# Patient Record
Sex: Female | Born: 1990 | Race: Black or African American | Hispanic: No | Marital: Single | Smoking: Current some day smoker
Health system: Southern US, Community
[De-identification: ages and names within clinical notes are randomized; demographics above are authoritative.]

## PROBLEM LIST (undated history)

## (undated) ENCOUNTER — Ambulatory Visit: Admission: EM

## (undated) DIAGNOSIS — M1990 Unspecified osteoarthritis, unspecified site: Secondary | ICD-10-CM

## (undated) DIAGNOSIS — R1115 Cyclical vomiting syndrome unrelated to migraine: Secondary | ICD-10-CM

## (undated) DIAGNOSIS — R87629 Unspecified abnormal cytological findings in specimens from vagina: Secondary | ICD-10-CM

## (undated) HISTORY — PX: NO PAST SURGERIES: SHX2092

---
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
## 2008-01-01 ENCOUNTER — Ambulatory Visit: Payer: Self-pay | Admitting: Family Medicine

## 2008-01-01 LAB — CONVERTED CEMR LAB: Beta hcg, urine, semiquantitative: POSITIVE

## 2008-01-10 ENCOUNTER — Telehealth: Payer: Self-pay | Admitting: Family Medicine

## 2008-01-15 ENCOUNTER — Ambulatory Visit: Payer: Self-pay | Admitting: Family Medicine

## 2008-01-15 ENCOUNTER — Encounter: Payer: Self-pay | Admitting: Family Medicine

## 2008-01-15 LAB — CONVERTED CEMR LAB
Basophils Absolute: 0 10*3/uL (ref 0.0–0.1)
Eosinophils Relative: 1 % (ref 0–5)
HCV Ab: NEGATIVE
Hepatitis B Surface Ag: NEGATIVE
Lymphocytes Relative: 36 % (ref 12–46)
Lymphs Abs: 2.2 10*3/uL (ref 0.7–4.0)
Neutro Abs: 3.1 10*3/uL (ref 1.7–7.7)
Neutrophils Relative %: 51 % (ref 43–77)
Platelets: 203 10*3/uL (ref 150–400)
RDW: 13.1 % (ref 11.5–15.5)
Rubella: 250.8 intl units/mL — ABNORMAL HIGH
WBC: 6.1 10*3/uL (ref 4.0–10.5)

## 2008-01-29 ENCOUNTER — Ambulatory Visit: Payer: Self-pay | Admitting: Family Medicine

## 2008-01-29 ENCOUNTER — Encounter: Payer: Self-pay | Admitting: Family Medicine

## 2008-01-29 DIAGNOSIS — Z87891 Personal history of nicotine dependence: Secondary | ICD-10-CM | POA: Insufficient documentation

## 2008-01-29 DIAGNOSIS — D573 Sickle-cell trait: Secondary | ICD-10-CM | POA: Insufficient documentation

## 2008-01-29 LAB — CONVERTED CEMR LAB: Glucose, Urine, Semiquant: NEGATIVE

## 2008-01-30 ENCOUNTER — Telehealth: Payer: Self-pay | Admitting: *Deleted

## 2008-01-30 LAB — CONVERTED CEMR LAB: Chlamydia, DNA Probe: NEGATIVE

## 2008-01-31 ENCOUNTER — Ambulatory Visit: Payer: Self-pay | Admitting: Family Medicine

## 2008-01-31 ENCOUNTER — Encounter: Payer: Self-pay | Admitting: Family Medicine

## 2008-01-31 ENCOUNTER — Ambulatory Visit (HOSPITAL_COMMUNITY): Admission: RE | Admit: 2008-01-31 | Discharge: 2008-01-31 | Payer: Self-pay | Admitting: Family Medicine

## 2008-01-31 DIAGNOSIS — A5400 Gonococcal infection of lower genitourinary tract, unspecified: Secondary | ICD-10-CM | POA: Insufficient documentation

## 2008-03-02 ENCOUNTER — Ambulatory Visit: Payer: Self-pay | Admitting: Family Medicine

## 2008-03-02 LAB — CONVERTED CEMR LAB: Glucose, Urine, Semiquant: NEGATIVE

## 2008-03-05 ENCOUNTER — Ambulatory Visit: Payer: Self-pay | Admitting: Family Medicine

## 2008-03-05 ENCOUNTER — Encounter: Payer: Self-pay | Admitting: Family Medicine

## 2008-03-05 ENCOUNTER — Telehealth: Payer: Self-pay | Admitting: *Deleted

## 2008-03-05 LAB — CONVERTED CEMR LAB
Chlamydia, DNA Probe: NEGATIVE
GC Probe Amp, Genital: NEGATIVE

## 2008-03-06 ENCOUNTER — Telehealth: Payer: Self-pay | Admitting: Family Medicine

## 2008-04-06 ENCOUNTER — Telehealth: Payer: Self-pay | Admitting: *Deleted

## 2008-04-07 ENCOUNTER — Ambulatory Visit: Payer: Self-pay | Admitting: Family Medicine

## 2008-04-07 LAB — CONVERTED CEMR LAB

## 2008-04-09 ENCOUNTER — Ambulatory Visit: Payer: Self-pay | Admitting: Family Medicine

## 2008-04-09 LAB — CONVERTED CEMR LAB: Protein, U semiquant: NEGATIVE

## 2008-04-21 ENCOUNTER — Ambulatory Visit (HOSPITAL_COMMUNITY): Admission: RE | Admit: 2008-04-21 | Discharge: 2008-04-21 | Payer: Self-pay | Admitting: Family Medicine

## 2008-04-21 ENCOUNTER — Encounter: Payer: Self-pay | Admitting: Family Medicine

## 2008-05-13 ENCOUNTER — Ambulatory Visit: Payer: Self-pay | Admitting: Family Medicine

## 2008-06-08 ENCOUNTER — Ambulatory Visit: Payer: Self-pay | Admitting: Family Medicine

## 2008-06-08 ENCOUNTER — Encounter: Payer: Self-pay | Admitting: Family Medicine

## 2008-06-08 LAB — CONVERTED CEMR LAB: Glucose, Urine, Semiquant: NEGATIVE

## 2008-06-22 ENCOUNTER — Ambulatory Visit: Payer: Self-pay | Admitting: Family Medicine

## 2008-07-08 ENCOUNTER — Ambulatory Visit: Payer: Self-pay | Admitting: Family Medicine

## 2008-07-22 ENCOUNTER — Ambulatory Visit: Payer: Self-pay | Admitting: Family Medicine

## 2008-07-22 LAB — CONVERTED CEMR LAB: Protein, U semiquant: NEGATIVE

## 2008-08-07 ENCOUNTER — Ambulatory Visit: Payer: Self-pay | Admitting: Family Medicine

## 2008-08-07 ENCOUNTER — Encounter: Payer: Self-pay | Admitting: Family Medicine

## 2008-08-10 ENCOUNTER — Ambulatory Visit: Payer: Self-pay | Admitting: Obstetrics & Gynecology

## 2008-08-10 ENCOUNTER — Inpatient Hospital Stay (HOSPITAL_COMMUNITY): Admission: AD | Admit: 2008-08-10 | Discharge: 2008-08-10 | Payer: Self-pay | Admitting: Gynecology

## 2008-08-11 ENCOUNTER — Ambulatory Visit: Payer: Self-pay | Admitting: Obstetrics & Gynecology

## 2008-08-11 ENCOUNTER — Inpatient Hospital Stay (HOSPITAL_COMMUNITY): Admission: AD | Admit: 2008-08-11 | Discharge: 2008-08-11 | Payer: Self-pay | Admitting: Obstetrics & Gynecology

## 2008-08-11 ENCOUNTER — Telehealth: Payer: Self-pay | Admitting: *Deleted

## 2008-08-12 ENCOUNTER — Encounter: Payer: Self-pay | Admitting: Family Medicine

## 2008-08-12 ENCOUNTER — Ambulatory Visit: Payer: Self-pay | Admitting: Family Medicine

## 2008-08-12 LAB — CONVERTED CEMR LAB: GC Probe Amp, Urine: NEGATIVE

## 2008-08-13 ENCOUNTER — Inpatient Hospital Stay (HOSPITAL_COMMUNITY): Admission: AD | Admit: 2008-08-13 | Discharge: 2008-08-13 | Payer: Self-pay | Admitting: Obstetrics & Gynecology

## 2008-08-13 ENCOUNTER — Ambulatory Visit: Payer: Self-pay | Admitting: Obstetrics and Gynecology

## 2008-08-19 ENCOUNTER — Ambulatory Visit: Payer: Self-pay | Admitting: Family Medicine

## 2008-08-25 ENCOUNTER — Ambulatory Visit: Payer: Self-pay | Admitting: Family Medicine

## 2008-08-26 ENCOUNTER — Inpatient Hospital Stay (HOSPITAL_COMMUNITY): Admission: AD | Admit: 2008-08-26 | Discharge: 2008-08-28 | Payer: Self-pay | Admitting: Obstetrics and Gynecology

## 2008-08-26 ENCOUNTER — Ambulatory Visit: Payer: Self-pay | Admitting: Family

## 2008-08-26 ENCOUNTER — Ambulatory Visit: Payer: Self-pay | Admitting: Family Medicine

## 2008-08-26 DIAGNOSIS — Z5189 Encounter for other specified aftercare: Secondary | ICD-10-CM

## 2008-09-07 ENCOUNTER — Telehealth: Payer: Self-pay | Admitting: *Deleted

## 2008-10-02 ENCOUNTER — Emergency Department (HOSPITAL_COMMUNITY): Admission: EM | Admit: 2008-10-02 | Discharge: 2008-10-02 | Payer: Self-pay | Admitting: Emergency Medicine

## 2008-10-12 ENCOUNTER — Encounter: Payer: Self-pay | Admitting: Family Medicine

## 2008-10-12 ENCOUNTER — Ambulatory Visit: Payer: Self-pay | Admitting: Family Medicine

## 2009-05-16 ENCOUNTER — Emergency Department (HOSPITAL_COMMUNITY): Admission: EM | Admit: 2009-05-16 | Discharge: 2009-05-16 | Payer: Self-pay | Admitting: Emergency Medicine

## 2009-05-20 ENCOUNTER — Ambulatory Visit: Payer: Self-pay | Admitting: Family Medicine

## 2009-05-20 DIAGNOSIS — T148 Other injury of unspecified body region: Secondary | ICD-10-CM | POA: Insufficient documentation

## 2009-05-20 DIAGNOSIS — W57XXXA Bitten or stung by nonvenomous insect and other nonvenomous arthropods, initial encounter: Secondary | ICD-10-CM

## 2009-10-08 IMAGING — US US OB COMP LESS 14 WK
1 series · 14 of 19 positions shown · non-contrast
Comparison: none

OBSTETRICAL ULTRASOUND:
 This ultrasound exam was performed in the [HOSPITAL] Ultrasound Department.  The OB US report was generated in the AS system, and faxed to the ordering physician.  This report is also available in [REDACTED] PACS.

[Series 1: us ob comp less 14 wk · 0.19mm/px · 14 of 19 slices shown]
[im 1/19]
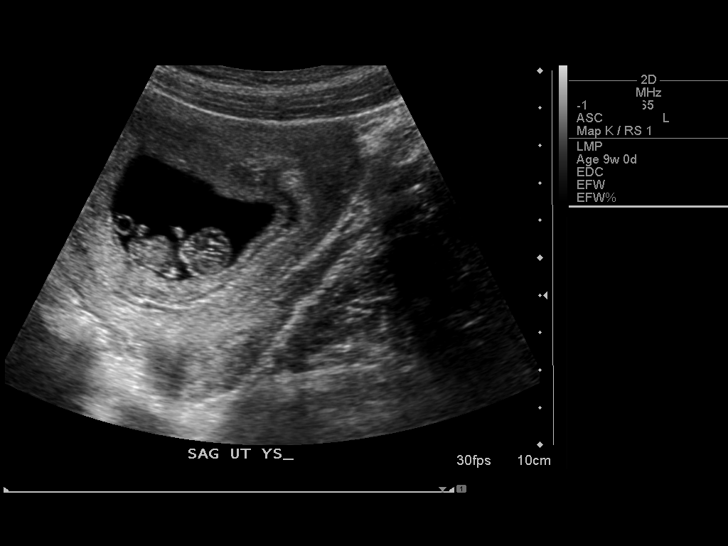
[im 3/19]
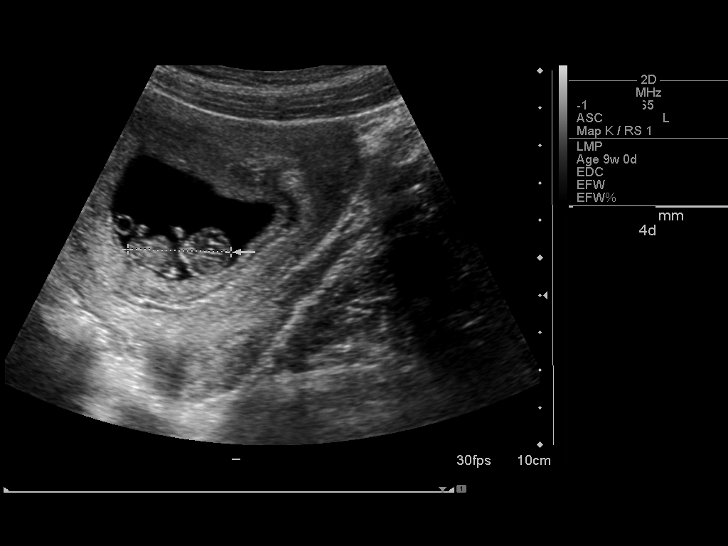
[im 4/19]
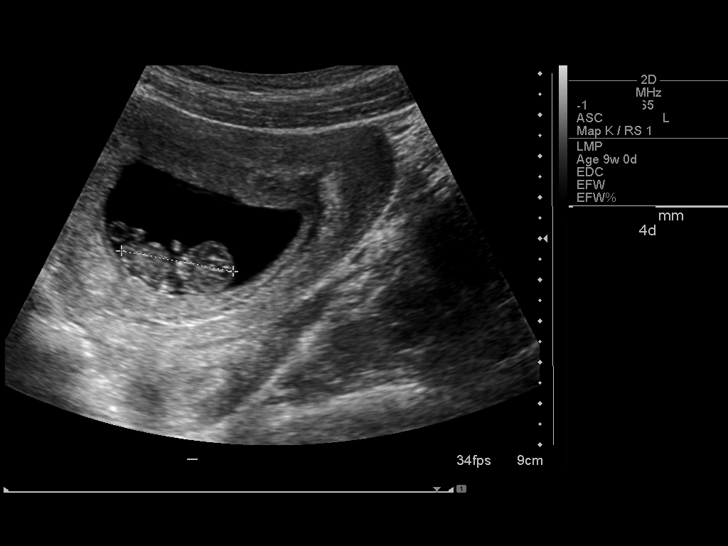
[im 5/19]
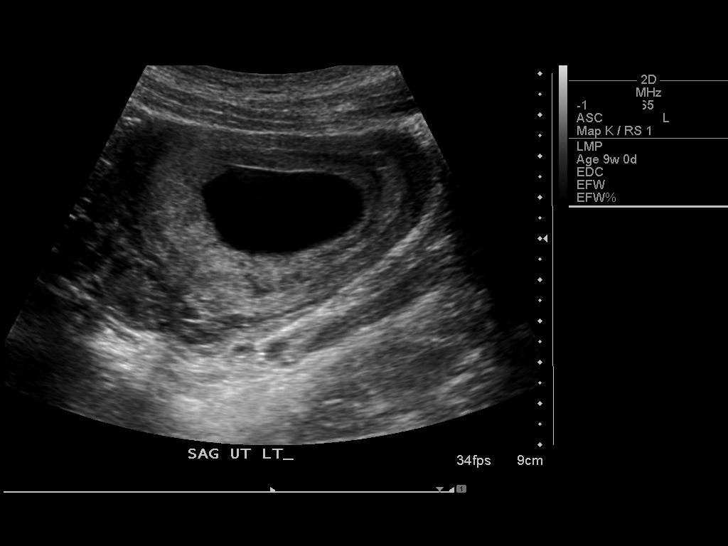
[im 7/19]
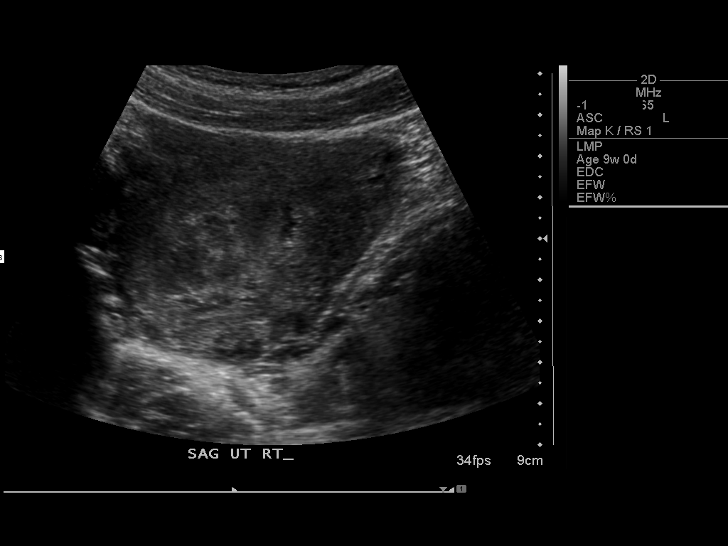
[im 8/19]
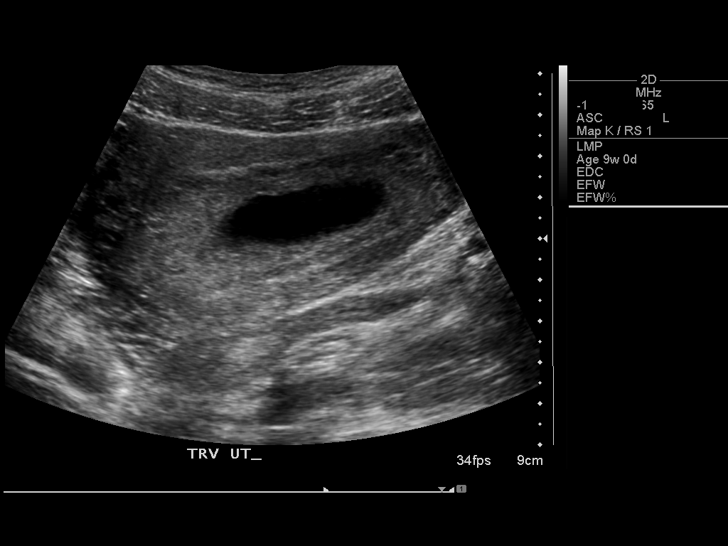
[im 9/19]
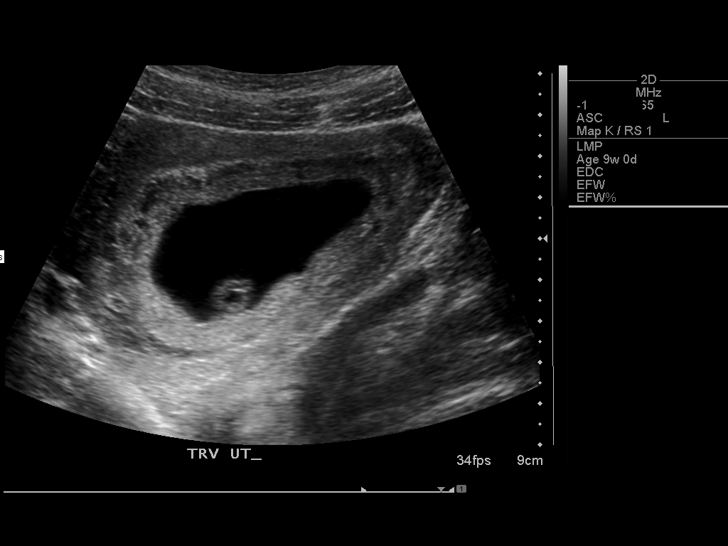
[im 11/19]
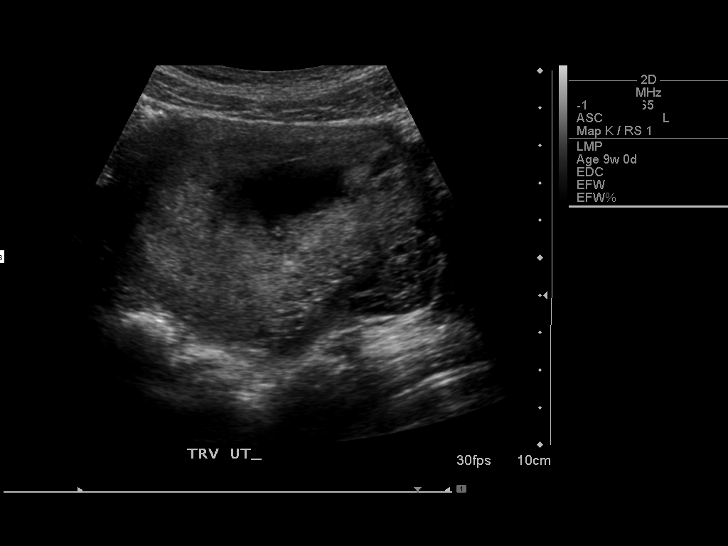
[im 12/19]
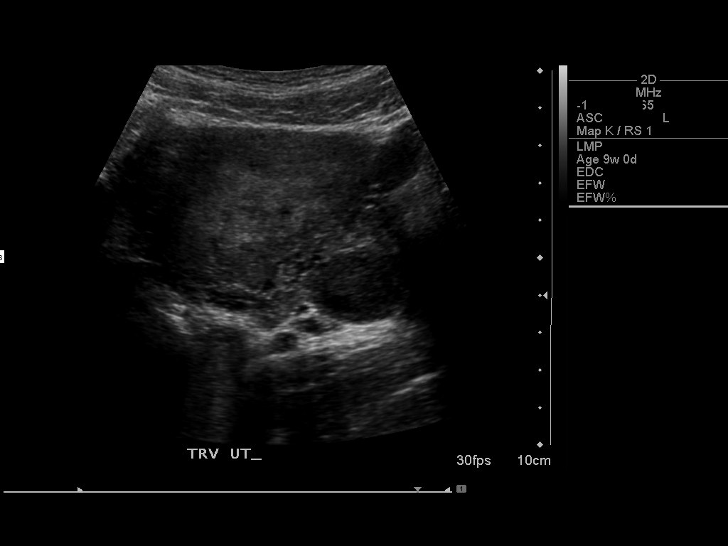
[im 13/19]
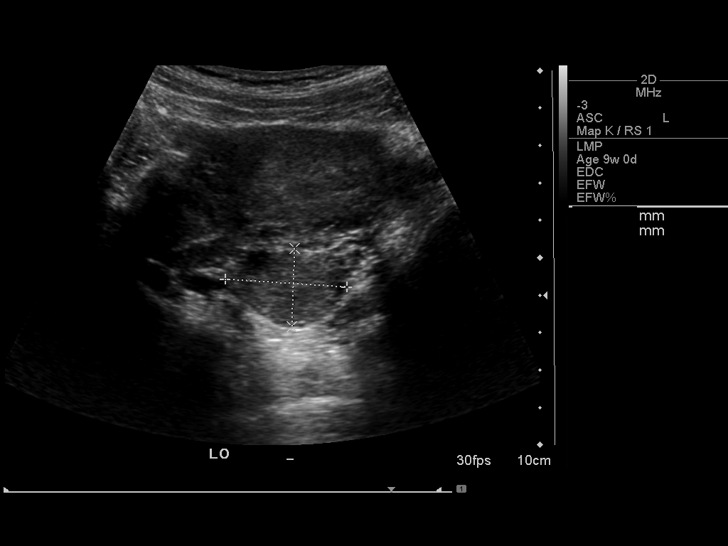
[im 15/19]
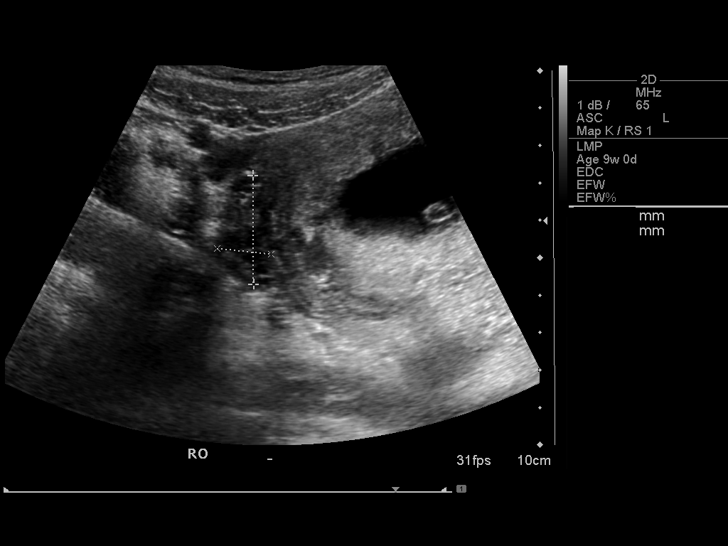
[im 16/19]
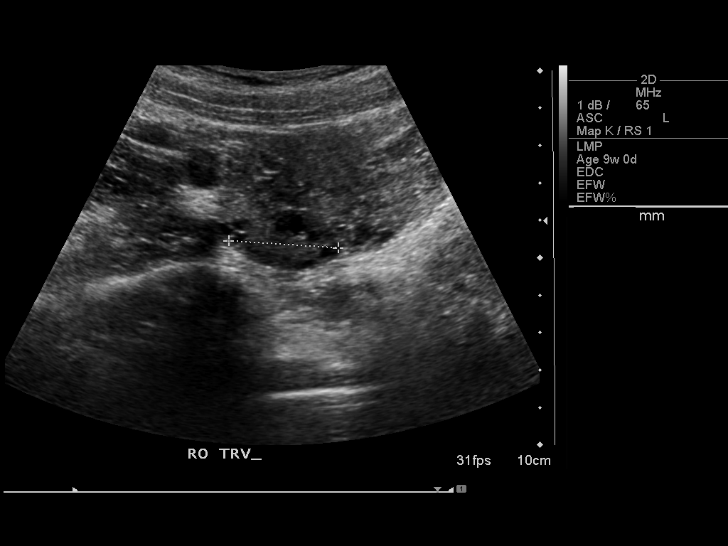
[im 17/19]
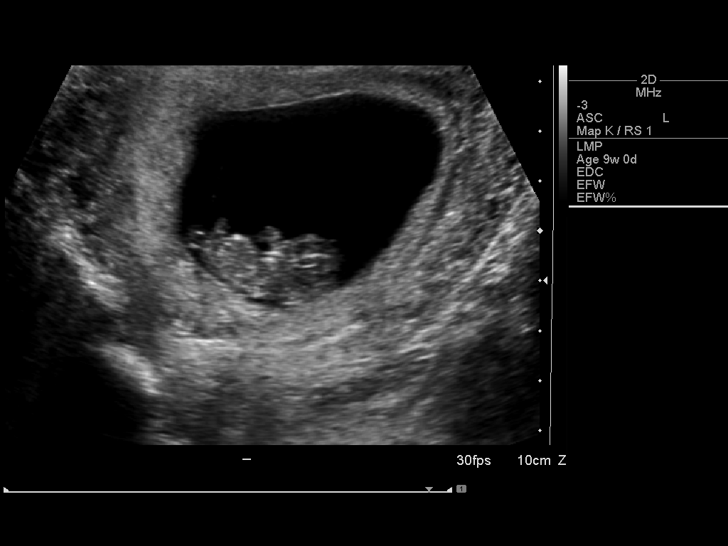
[im 19/19]
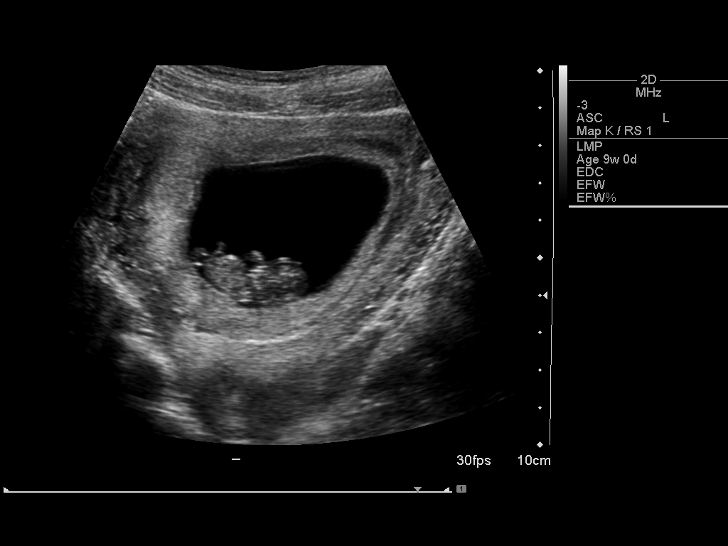

[14 of 19 positions shown; findings below may reference images not displayed]

IMPRESSION: See AS Obstetric US report.

## 2009-11-11 ENCOUNTER — Encounter: Payer: Self-pay | Admitting: Family Medicine

## 2009-11-11 ENCOUNTER — Ambulatory Visit: Payer: Self-pay | Admitting: Family Medicine

## 2009-11-12 LAB — CONVERTED CEMR LAB
Chlamydia, DNA Probe: POSITIVE — AB
GC Probe Amp, Genital: NEGATIVE

## 2010-03-09 ENCOUNTER — Emergency Department (HOSPITAL_COMMUNITY): Admission: EM | Admit: 2010-03-09 | Discharge: 2010-03-09 | Payer: Self-pay | Admitting: Emergency Medicine

## 2010-07-24 ENCOUNTER — Emergency Department (HOSPITAL_COMMUNITY): Admission: EM | Admit: 2010-07-24 | Discharge: 2010-07-24 | Payer: Self-pay | Admitting: Emergency Medicine

## 2010-09-27 NOTE — Assessment & Plan Note (Signed)
Summary: check for herpes per pt/eo   Vital Signs:  Patient profile:   20 year old female Height:      62 inches Weight:      127 pounds BP sitting:   110 / 68  (right arm) Cuff size:   regular  Vitals Entered By: Tessie Fass CMA (November 11, 2009 3:08 PM) CC: STD check Is Patient Diabetic? No Pain Assessment Patient in pain? no        Primary Care Provider:  Marisue Ivan  MD  CC:  STD check.  History of Present Illness: 1.  STD check--in Sept, had sex with someone.  She has since found out that partner had herpes.  She would like to be tested for all STDs possible.  No symptoms:  rash, ulcers or genital lesions, fever, abd pain, vag itching, vag discharge.   [phone] (mssg ok)  Habits & Providers  Alcohol-Tobacco-Diet     Tobacco Status: current     Cigarette Packs/Day: 0.5  Current Medications (verified): 1)  None  Allergies: No Known Drug Allergies  Social History: Smoking Status:  current Packs/Day:  0.5  Review of Systems General:  Denies fever and weight loss. GU:  Denies abnormal vaginal bleeding, dysuria, and genital sores.  Physical Exam  General:  Well-developed,well-nourished,in no acute distress; alert,appropriate and cooperative throughout examination Genitalia:  Pelvic Exam:        External: normal female genitalia without lesions or masses        Vagina: normal without lesions or masses; whitish vaginal discharge        Cervix: normal without lesions or masses Additional Exam:  vital signs reviewed    Impression & Recommendations:  Problem # 1:  CONTACT OR EXPOSURE TO OTHER VIRAL DISEASES (ICD-V01.79) Assessment Unchanged tests for stds as below.  advised condoms.  she has no hx of herpetic-sounding lesions and no evidence of any herpes on exam.  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   will call her with results.   Orders: GC/Chlamydia-[facility] ([id number]) Wet Prep- [facility] [phone]) HIV-[facility]  ([id number]) RPR-[facility] [phone]) [facility]- Est Level  3 (29518)  Patient Instructions: 1)  It was nice to see you today. 2)  Using condoms when you have sex will help to protect you against STDs.  But remember, it's not 100% protection. 3)  I will call you with lab results.    Laboratory Results  Date/Time Received: November 11, 2009 3:25 PM  Date/Time Reported: November 11, 2009 4:09 PM   [employer] Source: vaginal WBC/hpf: 5-10 Bacteria/hpf: 3+  Rods Clue cells/hpf: none  Negative whiff Yeast/hpf: none Trichomonas/hpf: none Comments: ...........test performed by...........Marland KitchenTerese Door, CMA

## 2010-12-12 LAB — CBC
MCHC: 32.8 g/dL (ref 30.0–36.0)
Platelets: 153 10*3/uL (ref 150–400)
RBC: 2.14 MIL/uL — ABNORMAL LOW (ref 3.87–5.11)
RDW: 14.5 % (ref 11.5–15.5)

## 2010-12-12 LAB — PREPARE RBC (CROSSMATCH)

## 2010-12-13 LAB — DIFFERENTIAL
Eosinophils Absolute: 0.1 10*3/uL (ref 0.0–0.7)
Lymphs Abs: 1.9 10*3/uL (ref 0.7–4.0)
Monocytes Absolute: 0.4 10*3/uL (ref 0.1–1.0)
Monocytes Relative: 9 % (ref 3–12)
Neutro Abs: 1.6 10*3/uL — ABNORMAL LOW (ref 1.7–7.7)
Neutrophils Relative %: 40 % — ABNORMAL LOW (ref 43–77)

## 2010-12-13 LAB — CBC
HCT: 35 % — ABNORMAL LOW (ref 36.0–46.0)
Platelets: 166 10*3/uL (ref 150–400)
WBC: 4.1 10*3/uL (ref 4.0–10.5)

## 2010-12-27 ENCOUNTER — Encounter: Payer: Self-pay | Admitting: Family Medicine

## 2010-12-27 ENCOUNTER — Ambulatory Visit (INDEPENDENT_AMBULATORY_CARE_PROVIDER_SITE_OTHER): Payer: Federal, State, Local not specified - PPO | Admitting: Family Medicine

## 2010-12-27 VITALS — BP 104/65 | HR 72 | Temp 98.8°F | Ht 62.0 in | Wt 122.1 lb

## 2010-12-27 DIAGNOSIS — N760 Acute vaginitis: Secondary | ICD-10-CM

## 2010-12-27 DIAGNOSIS — Z711 Person with feared health complaint in whom no diagnosis is made: Secondary | ICD-10-CM

## 2010-12-27 DIAGNOSIS — Z23 Encounter for immunization: Secondary | ICD-10-CM

## 2010-12-27 DIAGNOSIS — Z124 Encounter for screening for malignant neoplasm of cervix: Secondary | ICD-10-CM

## 2010-12-27 MED ORDER — TETANUS-DIPHTH-ACELL PERTUSSIS 5-2.5-18.5 LF-MCG/0.5 IM SUSP
0.5000 mL | Freq: Once | INTRAMUSCULAR | Status: AC
  Administered 2010-12-27: 0.5 mL via INTRAMUSCULAR

## 2010-12-27 NOTE — Progress Notes (Signed)
  Subjective:    Patient ID: Julia Price, female    DOB: 1990-09-16, 20 y.o.   MRN: [id number]  HPI 1. Concern for STD Patient has multiple partners of the same sex and does not use protection. She has no symptoms, but is concerned for STD. See ROS  2. Concern for back pain Patient says ever since her pregnancy she has had back pain, she also had a job at a [employer], which aggravates her back. No point tenderness. No muscle spasm. No lower extremity neuropathy.  3. Physical Exam Normal female exam.   Review of Systems  Constitutional: Negative for fever, chills, activity change, appetite change and unexpected weight change.  HENT: Negative for sore throat, trouble swallowing and neck pain.   Eyes: Negative for pain and visual disturbance.  Respiratory: Negative for apnea, cough, shortness of breath and wheezing.   Cardiovascular: Negative for chest pain, palpitations and leg swelling.  Gastrointestinal: Negative for nausea, vomiting, abdominal pain, diarrhea, constipation, blood in stool and abdominal distention.  Genitourinary: Positive for menstrual problem. Negative for dysuria, frequency, hematuria, flank pain, vaginal bleeding, vaginal discharge and vaginal pain.  Musculoskeletal: Positive for back pain. Negative for joint swelling and arthralgias.  Neurological: Negative for dizziness, syncope, speech difficulty and headaches.  Hematological: Negative for adenopathy.  Psychiatric/Behavioral: Negative for hallucinations, behavioral problems, sleep disturbance and agitation. The patient is not nervous/anxious.        Objective:   Physical Exam  Constitutional: She is oriented to person, place, and time. She appears well-developed and well-nourished.  HENT:  Head: Normocephalic and atraumatic.  Eyes: Pupils are equal, round, and reactive to light.  Neck: No tracheal deviation present.  Cardiovascular: Normal rate and regular rhythm.  Exam reveals friction rub. Exam  reveals no gallop.   No murmur heard. Pulmonary/Chest: Effort normal and breath sounds normal. No respiratory distress. She has no wheezes. She has no rales.  Abdominal: Soft. Bowel sounds are normal. She exhibits no distension and no mass. There is no tenderness. There is no guarding.  Genitourinary: No vaginal discharge (normal appearing mucous discharge) found.  Lymphadenopathy:    She has no cervical adenopathy.  Neurological: She is alert and oriented to person, place, and time. No cranial nerve deficit.  Psychiatric: She has a normal mood and affect. Her behavior is normal. Judgment and thought content normal.  Speculum - normal appearing cervix and canal.      Assessment & Plan:  1. Concern for STD Will check GC/[facility] - HIV Speculum exam normal.  2. Concern for back pain Advised to decrease heavy lifting. Stretching exercises and yoga. Lower back strengthening exercises.  3. Physical Exam Normal female exam. F/u in one year, pap in one year.

## 2010-12-28 LAB — GC/CHLAMYDIA PROBE AMP, GENITAL
Chlamydia, DNA Probe: NEGATIVE
GC Probe Amp, Genital: NEGATIVE

## 2010-12-30 ENCOUNTER — Encounter: Payer: Self-pay | Admitting: Family Medicine

## 2011-03-22 ENCOUNTER — Emergency Department (HOSPITAL_COMMUNITY)
Admission: EM | Admit: 2011-03-22 | Discharge: 2011-03-22 | Disposition: A | Discharge status: Home / Self Care | Payer: No Typology Code available for payment source | Attending: Emergency Medicine | Admitting: Emergency Medicine

## 2011-03-22 ENCOUNTER — Emergency Department (HOSPITAL_COMMUNITY): Payer: No Typology Code available for payment source

## 2011-03-22 DIAGNOSIS — D573 Sickle-cell trait: Secondary | ICD-10-CM | POA: Insufficient documentation

## 2011-03-22 DIAGNOSIS — Y92410 Unspecified street and highway as the place of occurrence of the external cause: Secondary | ICD-10-CM | POA: Insufficient documentation

## 2011-03-22 DIAGNOSIS — S139XXA Sprain of joints and ligaments of unspecified parts of neck, initial encounter: Secondary | ICD-10-CM | POA: Insufficient documentation

## 2011-03-22 DIAGNOSIS — M25519 Pain in unspecified shoulder: Secondary | ICD-10-CM | POA: Insufficient documentation

## 2011-06-02 LAB — URINALYSIS, ROUTINE W REFLEX MICROSCOPIC
Protein, ur: NEGATIVE mg/dL
Urobilinogen, UA: 0.2 mg/dL (ref 0.0–1.0)

## 2011-06-02 LAB — CBC
HCT: 20.1 % — ABNORMAL LOW (ref 36.0–46.0)
Hemoglobin: 6.7 g/dL — CL (ref 12.0–15.0)
Hemoglobin: 7.2 g/dL — CL (ref 12.0–15.0)
MCHC: 33.2 g/dL (ref 30.0–36.0)
MCV: 86.8 fL (ref 78.0–100.0)
Platelets: 207 10*3/uL (ref 150–400)
RBC: 2.32 MIL/uL — ABNORMAL LOW (ref 3.87–5.11)
RBC: 2.53 MIL/uL — ABNORMAL LOW (ref 3.87–5.11)
WBC: 14.8 10*3/uL — ABNORMAL HIGH (ref 4.0–10.5)
WBC: 6.8 10*3/uL (ref 4.0–10.5)

## 2011-06-02 LAB — TYPE AND SCREEN: Antibody Screen: NEGATIVE

## 2011-06-02 LAB — RPR: RPR Ser Ql: NONREACTIVE

## 2011-06-19 ENCOUNTER — Ambulatory Visit (INDEPENDENT_AMBULATORY_CARE_PROVIDER_SITE_OTHER): Payer: No Typology Code available for payment source | Admitting: Family Medicine

## 2011-06-19 ENCOUNTER — Encounter: Payer: Self-pay | Admitting: Family Medicine

## 2011-06-19 DIAGNOSIS — S39012A Strain of muscle, fascia and tendon of lower back, initial encounter: Secondary | ICD-10-CM | POA: Insufficient documentation

## 2011-06-19 DIAGNOSIS — S335XXA Sprain of ligaments of lumbar spine, initial encounter: Secondary | ICD-10-CM

## 2011-06-19 NOTE — Progress Notes (Signed)
  Subjective:    Patient ID: Jersey Espinoza, female    DOB: May 17, 1991, 20 y.o.   MRN: [id number]  HPI Patient here for a work in appointment for 5-6 days of acute low back pain.  Went to [employer] 4 days ago for a back in jury that happened 2-3 days prior to that.  She states she was just standing up while at work (not carrying anything) and she began having low back pain.  She states she has had intermittent low back pain since childbirth several years ago.  Back pain described as bilateral low back, nonradiating.  She denies fever, chills, numbness, weakness, change in bowel or bladder habits.  While at [facility]- she states they did xrays which revealed arthritis and prescribed her flexeril, hydrocodone, meloxicam.  She states she still continues to be in 10/10 pain despite these medications.   Review of Systems Please see HPI    Objective:   Physical Exam GEN: Alert & Oriented, No acute distress.  Flat affect CV:  Regular Rate & Rhythm, no murmur Respiratory:  Normal work of breathing, CTAB Back Exam: Gets onto table without significant antalgia.  Normal strength hip flexion, normal sensation to light touch.  Straight leg raise causes ow back pain bilaterally, but no radiculopathy.  Patellar reflexes 2+ bilaterally.        Assessment & Plan:

## 2011-06-19 NOTE — Assessment & Plan Note (Signed)
Low back pain without evidence of deformity or spine impingement.  Appears to be paraspinal spasm.    Advised to continue with flexeril, hydrocodone, meloxicam as prescribed by urgent care.  May also try heat and ice as needed.  Will set her up for physical therapy and follow-up in 2 weeks.

## 2011-06-19 NOTE — Patient Instructions (Addendum)
Will set you up for physical therapy.  Let our office know if you dont hear about an appointment in the next few days Take it easy- if you notice severe pain, you are being too active. Follow-up in 2 weeks.  Back Pain, Adult Low back pain is very common. About 1 in 5 people have back pain. The cause of low back pain is rarely dangerous. The pain often gets better over time. About half of people with a sudden onset of back pain feel better in just 2 weeks. About 8 in 10 people feel better by 6 weeks.   CAUSES Some common causes of back pain include:  Strain of the muscles or ligaments supporting the spine.     Wear and tear (degeneration) of the spinal discs.     Arthritis.    Direct injury to the back.  DIAGNOSIS Most of the time, the direct cause of low back pain is not known. However, back pain can be treated effectively even when the exact cause of the pain is unknown. Answering your caregiver's questions about your overall health and symptoms is one of the most accurate ways to make sure the cause of your pain is not dangerous. If your caregiver needs more information, he or she may order lab work or imaging tests (X-rays or MRIs). However, even if imaging tests show changes in your back, this usually does not require surgery. HOME CARE INSTRUCTIONS For many people, back pain returns. Since low back pain is rarely dangerous, it is often a condition that people can learn to manage on their own.    Remain active. It is stressful on the back to sit or stand in one place. Do not sit, drive, or stand in one place for more than 30 minutes at a time. Take short walks on level surfaces as soon as pain allows. Try to increase the length of time you walk each day.     Do not stay in bed. Resting more than 1 or 2 days can delay your recovery.     Do not avoid exercise or work. Your body is made to move. It is not dangerous to be active, even though your back may hurt. Your back will likely heal  faster if you return to being active before your pain is gone.     Pay attention to your body when you  bend and lift. Many people have less discomfort when lifting if they bend their knees, keep the load close to their bodies, and avoid twisting. Often, the most comfortable positions are those that put less stress on your recovering back.     Find a comfortable position to sleep. Use a firm mattress and lie on your side with your knees slightly bent. If you lie on your back, put a pillow under your knees.     Only take over-the-counter or prescription medicines as directed by your caregiver. Over-the-counter medicines to reduce pain and inflammation are often the most helpful. Your caregiver may prescribe muscle relaxant drugs. These medicines help dull your pain so you can more quickly return to your normal activities and healthy exercise.     Put ice on the injured area.     Put ice in a plastic bag.     Place a towel between your skin and the bag.     Leave the ice on for 15 to 20 minutes, 3 to 4 times a day for the first 2 to 3 days. After that, ice  and heat may be alternated to reduce pain and spasms.     Ask your caregiver about trying back exercises and gentle massage. This may be of some benefit.     Avoid feeling anxious or stressed. Stress increases muscle tension and can worsen back pain. It is important to recognize when you are anxious or stressed and learn ways to manage it. Exercise is a great option.  SEEK MEDICAL CARE IF:  You have pain that is not relieved with rest or medicine.     You have pain that does not improve in 1 week.     You have new symptoms.     You are generally not feeling well.  SEEK IMMEDIATE MEDICAL CARE IF:    You have pain that radiates from your back into your legs.     You develop new bowel or bladder control problems.     You have unusual weakness or numbness in your arms or legs.     You develop nausea or vomiting.     You develop  abdominal pain.     You feel faint.  Document Released: 08/14/2005 Document Revised: 04/26/2011 Document Reviewed: 01/02/2011 [facility] Patient Information 2012 [place], [place].

## 2011-06-20 ENCOUNTER — Encounter (HOSPITAL_BASED_OUTPATIENT_CLINIC_OR_DEPARTMENT_OTHER): Payer: Self-pay | Admitting: Family Medicine

## 2011-06-20 ENCOUNTER — Emergency Department (HOSPITAL_BASED_OUTPATIENT_CLINIC_OR_DEPARTMENT_OTHER)
Admission: EM | Admit: 2011-06-20 | Discharge: 2011-06-20 | Disposition: A | Discharge status: Home / Self Care | Payer: Federal, State, Local not specified - PPO | Attending: Emergency Medicine | Admitting: Emergency Medicine

## 2011-06-20 DIAGNOSIS — X58XXXA Exposure to other specified factors, initial encounter: Secondary | ICD-10-CM | POA: Insufficient documentation

## 2011-06-20 DIAGNOSIS — S335XXA Sprain of ligaments of lumbar spine, initial encounter: Secondary | ICD-10-CM | POA: Insufficient documentation

## 2011-06-20 DIAGNOSIS — F17200 Nicotine dependence, unspecified, uncomplicated: Secondary | ICD-10-CM | POA: Insufficient documentation

## 2011-06-20 HISTORY — DX: Unspecified osteoarthritis, unspecified site: M19.90

## 2011-06-20 LAB — URINALYSIS, ROUTINE W REFLEX MICROSCOPIC
Hgb urine dipstick: NEGATIVE
Nitrite: NEGATIVE
Specific Gravity, Urine: 1.017 (ref 1.005–1.030)
Urobilinogen, UA: 0.2 mg/dL (ref 0.0–1.0)

## 2011-06-20 LAB — PREGNANCY, URINE: Preg Test, Ur: NEGATIVE

## 2011-06-20 MED ORDER — PREDNISONE 10 MG PO TABS: 20.0000 mg | ORAL_TABLET | Freq: Every day | ORAL | Status: AC

## 2011-06-20 MED ORDER — HYDROCODONE-ACETAMINOPHEN 5-325 MG PO TABS: 1.0000 | ORAL_TABLET | ORAL | Status: AC | PRN

## 2011-06-20 NOTE — ED Notes (Signed)
MD at bedside. 

## 2011-06-20 NOTE — ED Provider Notes (Addendum)
History     CSN: [id number] Arrival date & time: 06/20/2011 11:07 AM   First MD Initiated Contact with Patient 06/20/11 1147      Chief Complaint  Patient presents with  . Back Pain    (Consider location/radiation/quality/duration/timing/severity/associated sxs/prior treatment) HPI  Pain began at work one week ago while leaning weight to one side then had sharp pain in back went to [facility] on Thursday.  Pain in middle low back and radiates up bilaterally and equally.   They gave hydrocodone, flexeril, another medicine which she took without relief.  No weakness, numbness or tingling.  Increases with movement.  Patient seen at [facility] yesterday and no additional meds.  No other health problems.  Past Medical History  Diagnosis Date  . Arthritis     History reviewed. No pertinent past surgical history.  No family history on file.  History  Substance Use Topics  . Smoking status: Current Everyday Smoker -- 1.0 packs/day    Types: Cigarettes  . Smokeless tobacco: Not on file  . Alcohol Use: Yes    OB History    Grav Para Term Preterm Abortions TAB SAB Ect Mult Living                  Review of Systems  All other systems reviewed and are negative.    Allergies  Review of patient's allergies indicates no known allergies.  Home Medications   Current Outpatient Rx  Name Route Sig Dispense Refill  . FLEXERIL PO Oral Take by mouth.      Marland Kitchen HYDROCODONE-ACETAMINOPHEN PO Oral Take by mouth.        BP 105/68  Pulse 73  Temp(Src) 98.4 F (36.9 C) (Oral)  Resp 16  SpO2 100%  LMP 06/18/2011  Physical Exam  Nursing note and vitals reviewed. Constitutional: She is oriented to person, place, and time. She appears well-developed and well-nourished.  HENT:  Head: Normocephalic and atraumatic.  Right Ear: External ear normal.  Nose: Nose normal.  Mouth/Throat: Oropharynx is clear and moist.  Eyes: Conjunctivae and EOM are normal. Pupils are equal, round, and reactive to  light.  Cardiovascular: Normal rate.   Pulmonary/Chest: Effort normal and breath sounds normal.  Abdominal: Soft.  Musculoskeletal:       Lumbar back: She exhibits decreased range of motion and pain. She exhibits no tenderness, no bony tenderness, no swelling, no edema and no deformity.  Neurological: She is alert and oriented to person, place, and time. Coordination normal.    ED Course  Procedures (including critical care time)  Labs Reviewed - No data to display No results found.   No diagnosis found.    MDM          Hilario Quarry, MD 06/20/11 1457  Hilario Quarry, MD 06/21/11 4098  Hilario Quarry, MD 06/21/11 1191  Hilario Quarry, MD 06/21/11 [phone]

## 2011-06-20 NOTE — ED Notes (Signed)
Pt c/o low back pain. Pt sts she was seen at [facility] on [address] for same. Pt sts "xray showed arthritis to low back and spine issue". Pt sts she was given "pain medication, muscle relaxer and injection and referred to spine specialist". Pt sts she is here today due to meds "not helping". Pt ambulatory.

## 2011-07-04 ENCOUNTER — Telehealth: Payer: Self-pay | Admitting: Family Medicine

## 2011-07-04 ENCOUNTER — Ambulatory Visit: Payer: Federal, State, Local not specified - PPO | Attending: Family Medicine | Admitting: Physical Therapy

## 2011-07-04 DIAGNOSIS — M2569 Stiffness of other specified joint, not elsewhere classified: Secondary | ICD-10-CM | POA: Insufficient documentation

## 2011-07-04 DIAGNOSIS — M545 Low back pain, unspecified: Secondary | ICD-10-CM | POA: Insufficient documentation

## 2011-07-04 DIAGNOSIS — IMO0001 Reserved for inherently not codable concepts without codable children: Secondary | ICD-10-CM | POA: Insufficient documentation

## 2011-07-04 NOTE — Telephone Encounter (Signed)
Will forward to Dr Orton 

## 2011-07-04 NOTE — Telephone Encounter (Signed)
Went to PT and is having muscle spasms and would like to have something called in [facility]- [address]

## 2011-07-05 MED ORDER — CYCLOBENZAPRINE HCL 10 MG PO TABS: 10.0000 mg | ORAL_TABLET | Freq: Two times a day (BID) | ORAL | Status: AC | PRN

## 2011-07-05 NOTE — Telephone Encounter (Signed)
Sent in script 

## 2011-07-06 ENCOUNTER — Ambulatory Visit: Payer: Federal, State, Local not specified - PPO | Admitting: Physical Therapy

## 2011-07-13 ENCOUNTER — Ambulatory Visit: Payer: Federal, State, Local not specified - PPO | Admitting: Physical Therapy

## 2011-07-27 ENCOUNTER — Emergency Department (HOSPITAL_COMMUNITY)
Admission: EM | Admit: 2011-07-27 | Discharge: 2011-07-27 | Disposition: A | Discharge status: Home / Self Care | Payer: Federal, State, Local not specified - PPO | Attending: Emergency Medicine | Admitting: Emergency Medicine

## 2011-07-27 ENCOUNTER — Emergency Department (HOSPITAL_COMMUNITY)
Admission: EM | Admit: 2011-07-27 | Discharge: 2011-07-27 | Discharge status: Against Medical Advice | Payer: Federal, State, Local not specified - PPO | Attending: Emergency Medicine | Admitting: Emergency Medicine

## 2011-07-27 ENCOUNTER — Encounter (HOSPITAL_COMMUNITY): Payer: Self-pay

## 2011-07-27 DIAGNOSIS — H00019 Hordeolum externum unspecified eye, unspecified eyelid: Secondary | ICD-10-CM | POA: Insufficient documentation

## 2011-07-27 DIAGNOSIS — F17200 Nicotine dependence, unspecified, uncomplicated: Secondary | ICD-10-CM | POA: Insufficient documentation

## 2011-07-27 DIAGNOSIS — Z8739 Personal history of other diseases of the musculoskeletal system and connective tissue: Secondary | ICD-10-CM | POA: Insufficient documentation

## 2011-07-27 DIAGNOSIS — H5710 Ocular pain, unspecified eye: Secondary | ICD-10-CM | POA: Insufficient documentation

## 2011-07-27 MED ORDER — HYDROCODONE-ACETAMINOPHEN 5-325 MG PO TABS: 1.0000 | ORAL_TABLET | ORAL | Status: AC | PRN

## 2011-07-27 NOTE — ED Notes (Signed)
Pt complains of eye pain and irritation for two days

## 2011-07-27 NOTE — ED Provider Notes (Signed)
History     CSN: [id number] Arrival date & time: 07/27/2011  4:52 AM   First MD Initiated Contact with Patient 07/27/11 [phone]      Chief Complaint  Patient presents with  . Eye Pain    (Consider location/radiation/quality/duration/timing/severity/associated sxs/prior treatment) HPI SUBJECTIVE: Julia Price is a 20 y.o. female who complains of a left upper eyelid stye for 3 day(s). No fever, chills, no URI symptoms, no history of foreign body in the eye. Vision has been normal.  OBJECTIVE: She appears well, vitals are normal. Hordeolum noted left upper eyelid. PERLA, fundi normal. Visual acuity as noted. Ears, throat normal, no periorbital cellulitis, no neck lymphadenopathy.  ASSESSMENT: stye/hordeolum  PLAN: Frequent warm soaks, use antibiotic ophthalmic ointment as prescribed, and follow up if symptoms persist or worsen. It may take several days for this to resolve. Rarely, these persist or enlarge and in that event, she will need to see an Ophthalmologist. Patient agrees with the medical treatment plan. Rx Vicodin # 8  Past Medical History  Diagnosis Date  . Arthritis     History reviewed. No pertinent past surgical history.  History reviewed. No pertinent family history.  History  Substance Use Topics  . Smoking status: Current Everyday Smoker -- 1.0 packs/day    Types: Cigarettes  . Smokeless tobacco: Not on file  . Alcohol Use: Yes    OB History    Grav Para Term Preterm Abortions TAB SAB Ect Mult Living                  Review of Systems  Allergies  Review of patient's allergies indicates no known allergies.  Home Medications   Current Outpatient Rx  Name Route Sig Dispense Refill  . HYDROCODONE-ACETAMINOPHEN 5-325 MG PO TABS Oral Take 1 tablet by mouth every 4 (four) hours as needed for pain. 8 tablet 0    BP 105/67  Pulse 68  Temp(Src) 98 F (36.7 C) (Oral)  Resp 20  SpO2 99%  LMP 07/18/2011  Physical Exam  ED Course  Procedures  (including critical care time)  Labs Reviewed - No data to display No results found.   1. Hordeolum       MDM  Sty without complicating factors        Flint Melter, MD 07/27/11 [phone]

## 2011-07-27 NOTE — ED Notes (Signed)
Pt presents with swelling, redness to left eye lid, onset two days ago, denies trauma or injury

## 2011-07-27 NOTE — ED Notes (Signed)
Voiced understanding of instructions given 

## 2011-07-27 NOTE — ED Notes (Signed)
Pt has stye to her left eye for the past 2 days causing severe pain and swelling-using warm compresses-states area has not opened up yet

## 2011-08-30 ENCOUNTER — Emergency Department (HOSPITAL_COMMUNITY)
Admission: EM | Admit: 2011-08-30 | Discharge: 2011-08-30 | Disposition: A | Discharge status: Home / Self Care | Payer: Federal, State, Local not specified - PPO | Attending: Emergency Medicine | Admitting: Emergency Medicine

## 2011-08-30 ENCOUNTER — Encounter (HOSPITAL_COMMUNITY): Payer: Self-pay | Admitting: *Deleted

## 2011-08-30 DIAGNOSIS — R5383 Other fatigue: Secondary | ICD-10-CM | POA: Insufficient documentation

## 2011-08-30 DIAGNOSIS — N390 Urinary tract infection, site not specified: Secondary | ICD-10-CM | POA: Insufficient documentation

## 2011-08-30 DIAGNOSIS — R5381 Other malaise: Secondary | ICD-10-CM | POA: Insufficient documentation

## 2011-08-30 DIAGNOSIS — R112 Nausea with vomiting, unspecified: Secondary | ICD-10-CM | POA: Insufficient documentation

## 2011-08-30 DIAGNOSIS — M549 Dorsalgia, unspecified: Secondary | ICD-10-CM | POA: Insufficient documentation

## 2011-08-30 DIAGNOSIS — F17200 Nicotine dependence, unspecified, uncomplicated: Secondary | ICD-10-CM | POA: Insufficient documentation

## 2011-08-30 DIAGNOSIS — R109 Unspecified abdominal pain: Secondary | ICD-10-CM | POA: Insufficient documentation

## 2011-08-30 DIAGNOSIS — R1110 Vomiting, unspecified: Secondary | ICD-10-CM

## 2011-08-30 LAB — URINALYSIS, ROUTINE W REFLEX MICROSCOPIC
Ketones, ur: 15 mg/dL — AB
Nitrite: NEGATIVE
Protein, ur: NEGATIVE mg/dL
Urobilinogen, UA: 0.2 mg/dL (ref 0.0–1.0)
pH: 6.5 (ref 5.0–8.0)

## 2011-08-30 MED ORDER — SULFAMETHOXAZOLE-TRIMETHOPRIM 800-160 MG PO TABS: 1.0000 | ORAL_TABLET | Freq: Two times a day (BID) | ORAL | Status: AC

## 2011-08-30 MED ORDER — ONDANSETRON 8 MG PO TBDP: 8.0000 mg | ORAL_TABLET | Freq: Three times a day (TID) | ORAL | Status: AC | PRN

## 2011-08-30 MED ORDER — SODIUM CHLORIDE 0.9 % IV BOLUS (SEPSIS)
1000.0000 mL | Freq: Once | INTRAVENOUS | Status: AC
  Administered 2011-08-30: 1000 mL via INTRAVENOUS

## 2011-08-30 MED ORDER — ONDANSETRON HCL 4 MG/2ML IJ SOLN
4.0000 mg | Freq: Once | INTRAMUSCULAR | Status: AC
  Administered 2011-08-30: 4 mg via INTRAVENOUS
  Filled 2011-08-30: qty 2

## 2011-08-30 NOTE — ED Notes (Signed)
Patient stated she was able to keep fluids down and wanted to try to drink some more

## 2011-08-30 NOTE — ED Provider Notes (Signed)
History     CSN: [id number]  Arrival date & time 08/30/11  1432   First MD Initiated Contact with Patient 08/30/11 1902      Chief Complaint  Patient presents with  . Emesis  . Back Pain    (Consider location/radiation/quality/duration/timing/severity/associated sxs/prior treatment) Patient is a 21 y.o. female presenting with vomiting and back pain. The history is provided by the patient.  Emesis  This is a new problem. Associated symptoms include abdominal pain. Pertinent negatives include no diarrhea and no headaches.  Back Pain  Associated symptoms include abdominal pain. Pertinent negatives include no chest pain, no numbness, no headaches and no weakness.   patient is a nausea and vomiting is about 3 the morning. Her back and abdomen her from throwing up. No fevers. No diarrhea. Patient denies possibility of pregnancy. No fevers. She states she's been around a lot of children doesn't know for clear sick contact. No recent alcohol intake. She's overall healthy she states she feels weak when she stands up. No syncope. No head injury.  Past Medical History  Diagnosis Date  . Arthritis     History reviewed. No pertinent past surgical history.  No family history on file.  History  Substance Use Topics  . Smoking status: Current Everyday Smoker -- 1.0 packs/day    Types: Cigarettes  . Smokeless tobacco: Not on file  . Alcohol Use: Yes    OB History    Grav Para Term Preterm Abortions TAB SAB Ect Mult Living                  Review of Systems  Constitutional: Positive for appetite change and fatigue. Negative for activity change.  HENT: Negative for neck stiffness.   Eyes: Negative for pain.  Respiratory: Negative for chest tightness and shortness of breath.   Cardiovascular: Negative for chest pain and leg swelling.  Gastrointestinal: Positive for nausea, vomiting and abdominal pain. Negative for diarrhea.  Genitourinary: Negative for flank pain.  Musculoskeletal:  Positive for back pain.  Skin: Negative for rash.  Neurological: Negative for weakness, numbness and headaches.  Psychiatric/Behavioral: Negative for behavioral problems.    Allergies  Review of patient's allergies indicates no known allergies.  Home Medications   Current Outpatient Rx  Name Route Sig Dispense Refill  . ACETAMINOPHEN 500 MG PO TABS Oral Take 1,000 mg by mouth every 6 (six) hours as needed. For pain.     Marland Kitchen CALCIUM CARBONATE ANTACID 400 MG PO CHEW Oral Chew 2 tablets by mouth daily as needed. For stomach pain.     . IBUPROFEN 200 MG PO TABS Oral Take 800-1,200 mg by mouth every 8 (eight) hours as needed. For pain.     Marland Kitchen ONDANSETRON 8 MG PO TBDP Oral Take 1 tablet (8 mg total) by mouth every 8 (eight) hours as needed for nausea. 20 tablet 0  . SULFAMETHOXAZOLE-TRIMETHOPRIM 800-160 MG PO TABS Oral Take 1 tablet by mouth 2 (two) times daily. 6 tablet 0    BP 120/62  Pulse 88  Temp(Src) 98.3 F (36.8 C) (Oral)  Resp 16  SpO2 100%  LMP 08/16/2011  Physical Exam  Nursing note and vitals reviewed. Constitutional: She is oriented to person, place, and time. She appears well-developed and well-nourished.  HENT:  Head: Normocephalic and atraumatic.  Eyes: EOM are normal. Pupils are equal, round, and reactive to light.  Neck: Normal range of motion. Neck supple.  Cardiovascular: Normal rate, regular rhythm and normal heart sounds.   No  murmur heard. Pulmonary/Chest: Effort normal and breath sounds normal. No respiratory distress. She has no wheezes. She has no rales.  Abdominal: Soft. Bowel sounds are normal. She exhibits no distension. There is no tenderness. There is no rebound and no guarding.  Musculoskeletal: Normal range of motion.  Neurological: She is alert and oriented to person, place, and time. No cranial nerve deficit.  Skin: Skin is warm and dry.  Psychiatric: She has a normal mood and affect. Her speech is normal.    ED Course  Procedures (including  critical care time)  Labs Reviewed  URINALYSIS, ROUTINE W REFLEX MICROSCOPIC - Abnormal; Notable for the following:    Ketones, ur 15 (*)    Leukocytes, UA TRACE (*)    All other components within normal limits  URINE MICROSCOPIC-ADD ON - Abnormal; Notable for the following:    Squamous Epithelial / LPF FEW (*)    Bacteria, UA MANY (*)    All other components within normal limits   No results found.   1. Vomiting   2. UTI (lower urinary tract infection)       MDM  Nausea and vomiting. No diarrhea. No CVA tenderness or urinary symptoms. Urinalysis shows some mild dehydration and possible UTI. I doubt patient has a severe urinary tract infection causing the vomiting. She's tolerated orals here and feels better after IV fluids. She'll be discharged home.        Juliet Rude. Rubin Payor, MD 08/30/11 2213

## 2011-08-30 NOTE — ED Notes (Signed)
Patient stable upon discharge.  

## 2011-08-30 NOTE — ED Notes (Signed)
Pt states that she started having nausea and vomiting around 0300 this am. States that her back and abdomen hurt from vomiting so much. Pt states that she took two children pepto bismol but threw them up.

## 2011-11-23 ENCOUNTER — Telehealth: Payer: Self-pay | Admitting: Family Medicine

## 2011-11-23 NOTE — Telephone Encounter (Addendum)
Patient reports diarrhea all day yesterday. This AM developed vomiting and has vomited x 5-6 since then, last time justa little while ago. Has headache, body aches . When she vomits feels hot then feels cold. Has not taken temp . Doesn't have a thermometer. Denies sore throat, cough , congestion. Advised about clear liquids. Advised to start sipping  clear liquids 2 hours from now . I will call her back at  1:30 to check on. Consulted with Dr. Earnest Bailey also.

## 2011-11-23 NOTE — Telephone Encounter (Signed)
Patient is calling because she thinks she may have the flu and she wants to know what she can take over the counter.

## 2011-11-23 NOTE — Telephone Encounter (Signed)
Called patient back. She has vomited three more times  since previous conversation. Advised since  vomiting is not slowing down recommend she go to Urgent Care for evaluation. She will wait a little bit longer and if continuing will go to U/C.

## 2011-11-23 NOTE — Telephone Encounter (Signed)
Consulted with Dr. Deirdre Priest and if patient is continuing to vomit and not able to tolerate liquids will need to go to ED instead of Urgent Care.

## 2011-11-30 ENCOUNTER — Encounter: Payer: Self-pay | Admitting: Family Medicine

## 2011-11-30 ENCOUNTER — Ambulatory Visit (INDEPENDENT_AMBULATORY_CARE_PROVIDER_SITE_OTHER): Payer: Federal, State, Local not specified - PPO | Admitting: Family Medicine

## 2011-11-30 VITALS — BP 107/67 | HR 87 | Temp 98.7°F | Ht 63.0 in | Wt 134.0 lb

## 2011-11-30 DIAGNOSIS — N6459 Other signs and symptoms in breast: Secondary | ICD-10-CM

## 2011-11-30 DIAGNOSIS — R300 Dysuria: Secondary | ICD-10-CM

## 2011-11-30 LAB — POCT URINALYSIS DIPSTICK
Bilirubin, UA: NEGATIVE
Glucose, UA: NEGATIVE
Leukocytes, UA: NEGATIVE
Nitrite, UA: NEGATIVE
Urobilinogen, UA: 0.2

## 2011-11-30 MED ORDER — TRIAMCINOLONE ACETONIDE 0.1 % EX OINT: TOPICAL_OINTMENT | Freq: Two times a day (BID) | CUTANEOUS | Status: DC

## 2011-11-30 NOTE — Patient Instructions (Signed)
I think you may have eczema around your nipples. Try the triamcinolone (steroid) ointment twice a day for about a week or until the itchiness goes away.  Call the clinic and let me know if you have any more nipple discharge (blood, pus, or milk).  We can consider get a picture of your breast tissue if that happens.  Please also let me know if you have any signs of infection (redness, warmth around the breast; fevers/chills/nausea).  Follow-up as needed.

## 2011-11-30 NOTE — Assessment & Plan Note (Signed)
Patient having right nipple/breast skin irritation and ?discharge. I think the blood may have just been from trauma from her pulling off a skin tag around her nipple. I am not sure what the whitish colored discharge is, but I do not think it is milk (which the patient was concerned about). I think she has eczema around her nipples. They are dry, and she has a history of eczema. Will try TAC 0.1% ointment to see if this helps her symptoms. Patient's family history is concerning for breast cancer in great-aunt in her 20-30s. Besides questionable discharge, no other concerning signs or symptoms. Will defer on breast imaging at this time. Patient amenable to plan and given red flags to call or return to clinic to suggest breast tissue problem.

## 2011-11-30 NOTE — Progress Notes (Signed)
  Subjective:    Patient ID: Julia Price, female    DOB: 01/07/1991, 21 y.o.   MRN: [id number]  HPI Concern about infection in right breast.  She started having itching about 2-3wks ago, particularly around the nipple. Then recently, she pulled at a skin tag around the nipple and had some bloody and whitish discharge. She could not tell if it was pus, milk, or some other substance.  She has had no more discharge since then.  She denies fevers/chills/nausea. She denies taking any new medications. She is not breastfeeding nor has ever breast-fed. She denies using new soaps or deodorants. She does have a great-aunt who was diagnosed with breast cancer; patient is not sure of age, maybe 33-30s.  2. Requesting confirmation of resolution of urine infection  Diagnosed 08/2011 in the ED. UA positive for trace leukocytes, WBC 0-2, many bacteria. Finished course of Bactrim.  Review of Systems     Objective:   Physical Exam Gen: NAD Psych: appropriate to questions Breast: no nodules or concerning masses felt bilateral breasts and axilla   Right breast: nipples are cracked and dry; very small skin tags around nipple; no nipple discharge, erythema, warmth, tenderness   Left breast: normal; nipples dry but not as much as right  UA: normal    Assessment & Plan:  History of UTI diagnosed 08/2011. Finished course of Bactrim. UA today normal.

## 2011-12-06 ENCOUNTER — Telehealth: Payer: Self-pay | Admitting: Family Medicine

## 2011-12-06 ENCOUNTER — Encounter: Payer: Self-pay | Admitting: Family Medicine

## 2011-12-06 ENCOUNTER — Ambulatory Visit (INDEPENDENT_AMBULATORY_CARE_PROVIDER_SITE_OTHER): Payer: Federal, State, Local not specified - PPO | Admitting: Family Medicine

## 2011-12-06 VITALS — BP 92/56 | HR 88 | Temp 98.7°F | Ht 63.0 in | Wt 132.6 lb

## 2011-12-06 DIAGNOSIS — K920 Hematemesis: Secondary | ICD-10-CM | POA: Insufficient documentation

## 2011-12-06 LAB — POCT HEMOGLOBIN: Hemoglobin: 13.7 g/dL (ref 12.2–16.2)

## 2011-12-06 MED ORDER — OMEPRAZOLE 40 MG PO CPDR: 40.0000 mg | DELAYED_RELEASE_CAPSULE | Freq: Every day | ORAL | Status: DC

## 2011-12-06 MED ORDER — ONDANSETRON HCL 4 MG PO TABS: 4.0000 mg | ORAL_TABLET | Freq: Three times a day (TID) | ORAL | Status: AC | PRN

## 2011-12-06 NOTE — Progress Notes (Signed)
  Subjective:    Patient ID: Julia Price, female    DOB: 1991-02-27, 21 y.o.   MRN: [id number]  HPI  #1. Hematemesis: Patient is 21 year old female with sickle cell trait who states that she began having trouble with hematemesis this morning about 5 AM. She states she vomited and noticed several drops of blood in her vomit. Since then she's had about 6 times further vomiting with a mixture of bright red blood as well as dark or clotting blood. States it has not been much vomit but enough blood to halfway filled the toilet bowl. She said she had half a beer last night. She this has never happened before but.  No melena. No changes in bowel habits. She does have a history of recent viral gastroenteritis in both herself and other members of her household about one week ago. She states this has been resolved for about one week. Last food intake was yesterday around lunchtime. She has had some Sprite to drink today and has kept this down. No fevers or chills. She states she does have lightheadedness when she stands.  Review of Systems See HPI above for review of systems.       Objective:   Physical Exam Gen:  Alert, cooperative patient who appears stated age in no acute distress.  Vital signs reviewed. HEENT:  Carter/AT, Dry mucus membranes. Hordeolum noted Left eyelid.   Neck:  NO JVD Cardiac:  Regular rate and rhythm without murmur auscultated.  Good S1/S2. Pulm:  Clear to auscultation bilaterally with good air movement.  No wheezes or rales noted.   Abdomen:   Soft and nondistended. Somewhat tender to palpation bilateral lower quadrants. No epigastric or upper quadrant tenderness Extremities: No edema       Assessment & Plan:

## 2011-12-06 NOTE — Telephone Encounter (Signed)
Julia Price called to say that she drank half of a 24 oz bottle of [employer] beer last night.  Awakened this morning and vomitted blood.  Wanted to speak with the nurse to get advise about this.

## 2011-12-06 NOTE — Patient Instructions (Signed)
Take the Zofran/Odansetron as you need it for nausea, every 8 hours. Take the Omeprazole once daily to help with heartburn and to help your esophagus heal. Come back Friday or Monday so we can make sure you're still doing ok.

## 2011-12-06 NOTE — Assessment & Plan Note (Signed)
Orthostatics good and Hgb 13.7.  Plan to treat hypotension secondary to vomiting with oral rehydration and zofran to relieve nausea. She has been able to keep down sprite here. Omeprazole for relief of any possible Mallory-Wies tear.   FU in 2-4 days to assess for improvement, sooner if worsening.   May need to be on Omeprazole for 6 months or so, provided enough refills for 3 months total.  Defer to PCP.

## 2011-12-06 NOTE — Telephone Encounter (Signed)
Patient states she started vomiting at 5:00 this AM. The first time she vomited did not have any blood in vomitus. Has vomited 5 times since and did have blood in vomitus. She describes it as dark red and a lot of blood. She is laying in front of fan now because she is hot.  Consulted with Dr. Earnest Bailey and she advises to have patient come here now to be evaluated..  Advised to be NPO for now. States she drank some Sprite and it actually helped. She will come in now.

## 2012-02-23 ENCOUNTER — Telehealth: Payer: Self-pay | Admitting: Family Medicine

## 2012-02-23 NOTE — Telephone Encounter (Signed)
Patient is sure she has a yeast infection.  She wanted to be seen today, but we have no openings or cancellations for this afternoon.  Then she asked what she can get over the counter.

## 2012-02-23 NOTE — Telephone Encounter (Signed)
Patient informed she can try Monistat or Gynelotrimin OTC for yeast infection.  Patient will call office back for appt if not better.  Gaylene Brooks, RN

## 2012-03-04 ENCOUNTER — Other Ambulatory Visit: Payer: Self-pay | Admitting: Family Medicine

## 2012-05-10 ENCOUNTER — Encounter: Payer: Self-pay | Admitting: Family Medicine

## 2012-05-10 ENCOUNTER — Ambulatory Visit (INDEPENDENT_AMBULATORY_CARE_PROVIDER_SITE_OTHER): Payer: Federal, State, Local not specified - PPO | Admitting: Family Medicine

## 2012-05-10 ENCOUNTER — Other Ambulatory Visit (HOSPITAL_COMMUNITY)
Admission: RE | Admit: 2012-05-10 | Discharge: 2012-05-10 | Disposition: A | Discharge status: Home / Self Care | Payer: Federal, State, Local not specified - PPO | Source: Ambulatory Visit | Attending: Family Medicine | Admitting: Family Medicine

## 2012-05-10 VITALS — BP 107/66 | HR 101 | Temp 99.6°F | Ht 63.0 in | Wt 127.0 lb

## 2012-05-10 DIAGNOSIS — D573 Sickle-cell trait: Secondary | ICD-10-CM

## 2012-05-10 DIAGNOSIS — Z2089 Contact with and (suspected) exposure to other communicable diseases: Secondary | ICD-10-CM

## 2012-05-10 DIAGNOSIS — Z87891 Personal history of nicotine dependence: Secondary | ICD-10-CM

## 2012-05-10 DIAGNOSIS — Z20828 Contact with and (suspected) exposure to other viral communicable diseases: Secondary | ICD-10-CM

## 2012-05-10 DIAGNOSIS — Z113 Encounter for screening for infections with a predominantly sexual mode of transmission: Secondary | ICD-10-CM | POA: Insufficient documentation

## 2012-05-10 DIAGNOSIS — N760 Acute vaginitis: Secondary | ICD-10-CM

## 2012-05-10 DIAGNOSIS — B373 Candidiasis of vulva and vagina: Secondary | ICD-10-CM

## 2012-05-10 DIAGNOSIS — Z202 Contact with and (suspected) exposure to infections with a predominantly sexual mode of transmission: Secondary | ICD-10-CM

## 2012-05-10 LAB — POCT WET PREP (WET MOUNT): Clue Cells Wet Prep Whiff POC: NEGATIVE

## 2012-05-10 LAB — HIV ANTIBODY (ROUTINE TESTING W REFLEX): HIV: NONREACTIVE

## 2012-05-10 MED ORDER — FLUCONAZOLE 150 MG PO TABS: 150.0000 mg | ORAL_TABLET | Freq: Once | ORAL | Status: AC

## 2012-05-10 NOTE — Progress Notes (Signed)
Patient ID: Danyell Shader, female   DOB: March 06, 1991, 21 y.o.   MRN: [id number] Subjective: The patient is a 21 y.o. year old female who presents today for STD check.  Some itching and increased discharge.  No fevers/chills, no dysuria.  No abd pain.  LMP ~2 weeks ago. One new sexual partner.  Used latex condom 2 weeks ago, may be allergic to latex.  Does have history of STI about 2 years ago.  Patient's past medical, social, and family history were reviewed and updated as appropriate. History  Substance Use Topics  . Smoking status: Current Every Day Smoker -- 1.0 packs/day    Types: Cigarettes  . Smokeless tobacco: Not on file  . Alcohol Use: Yes   Objective:  Filed Vitals:   05/10/12 1014  BP: 107/66  Pulse: 101  Temp: 99.6 F (37.6 C)   Pelvic exam: normal external genitalia, vulva, vagina, cervix, uterus and adnexa, some thick white discharge present.  No CMT.   Assessment/Plan: STD check, diagnosis yeast infection.  Rx diflucan.  Will contact if STD results are positive.  Please also see individual problems in problem list for problem-specific plans.

## 2012-05-10 NOTE — Patient Instructions (Signed)
You have a yeast infection.  I have sent in a prescription for a one time pill that should get rid of it. I will be sending you a letter or calling you next week about results of your blood tests.

## 2012-05-11 LAB — RPR

## 2012-05-13 ENCOUNTER — Encounter: Payer: Self-pay | Admitting: Family Medicine

## 2012-12-09 ENCOUNTER — Other Ambulatory Visit: Payer: Self-pay | Admitting: Family Medicine

## 2013-09-08 ENCOUNTER — Ambulatory Visit (INDEPENDENT_AMBULATORY_CARE_PROVIDER_SITE_OTHER): Payer: Federal, State, Local not specified - PPO | Admitting: Family Medicine

## 2013-09-08 ENCOUNTER — Other Ambulatory Visit (HOSPITAL_COMMUNITY)
Admission: RE | Admit: 2013-09-08 | Discharge: 2013-09-08 | Disposition: A | Discharge status: Home / Self Care | Payer: Federal, State, Local not specified - PPO | Source: Ambulatory Visit | Attending: Family Medicine | Admitting: Family Medicine

## 2013-09-08 VITALS — BP 96/59 | HR 81 | Temp 99.6°F | Ht 63.0 in | Wt 133.0 lb

## 2013-09-08 DIAGNOSIS — L259 Unspecified contact dermatitis, unspecified cause: Secondary | ICD-10-CM

## 2013-09-08 DIAGNOSIS — N949 Unspecified condition associated with female genital organs and menstrual cycle: Secondary | ICD-10-CM

## 2013-09-08 DIAGNOSIS — Z202 Contact with and (suspected) exposure to infections with a predominantly sexual mode of transmission: Secondary | ICD-10-CM

## 2013-09-08 DIAGNOSIS — Z113 Encounter for screening for infections with a predominantly sexual mode of transmission: Secondary | ICD-10-CM | POA: Insufficient documentation

## 2013-09-08 DIAGNOSIS — R102 Pelvic and perineal pain: Secondary | ICD-10-CM

## 2013-09-08 DIAGNOSIS — L309 Dermatitis, unspecified: Secondary | ICD-10-CM | POA: Insufficient documentation

## 2013-09-08 DIAGNOSIS — Z9189 Other specified personal risk factors, not elsewhere classified: Secondary | ICD-10-CM

## 2013-09-08 LAB — POCT WET PREP (WET MOUNT): CLUE CELLS WET PREP WHIFF POC: NEGATIVE

## 2013-09-08 LAB — POCT URINALYSIS DIPSTICK
Bilirubin, UA: NEGATIVE
GLUCOSE UA: NEGATIVE
Ketones, UA: NEGATIVE
Leukocytes, UA: NEGATIVE
NITRITE UA: NEGATIVE
PROTEIN UA: NEGATIVE
RBC UA: NEGATIVE
SPEC GRAV UA: 1.02
UROBILINOGEN UA: 0.2
pH, UA: 7

## 2013-09-08 LAB — HIV ANTIBODY (ROUTINE TESTING W REFLEX): HIV: NONREACTIVE

## 2013-09-08 LAB — RPR

## 2013-09-08 MED ORDER — TRIAMCINOLONE ACETONIDE 0.1 % EX OINT: TOPICAL_OINTMENT | CUTANEOUS | Status: DC

## 2013-09-08 NOTE — Progress Notes (Signed)
   Subjective:    Patient ID: Julia Price, female    DOB: 12/09/1990, 23 y.o.   MRN: [id number]  HPI  Vaginal pain Ms. Sharol HarnessSimmons comes in today with a 4-day history of vaginal soreness and itchiness. She notices pain mainly when she wipes. She has associated yellow discharge with no bleeding or odors. She has been previously treated for gonorrhea, chlamydia and a yeast infections. She has sex with females only. She has a history of about 10 lifetime partners with 3 in the last year and 1 currently for the past one month. She has not recently been tested for STIs and would like to be tested today. Partner has not been tested to her knowledge.  Eczema Patient continues to have eczema rash on her face and has run out of her cream. She has not been having any side effects from cream.  Review of Systems Refer to HPI    Objective:   Physical Exam  Vitals reviewed. Constitutional: She appears well-developed and well-nourished.  Genitourinary: Vagina normal. There is no rash, tenderness, lesion or injury on the right labia. There is no rash, tenderness, lesion or injury on the left labia. Uterus is not tender. Cervix exhibits no motion tenderness, no discharge and no friability. Right adnexum displays no mass, no tenderness and no fullness. Left adnexum displays no mass, no tenderness and no fullness. No erythema, tenderness or bleeding around the vagina. No foreign body around the vagina. No signs of injury around the vagina. No vaginal discharge found.  Skin: Rash noted. Rash is maculopapular (located mostly on forehead. +erythema.).          Assessment & Plan:

## 2013-09-08 NOTE — Assessment & Plan Note (Signed)
Will refill patient's cream. Patient will follow-up with PCP for continued management.

## 2013-09-08 NOTE — Patient Instructions (Addendum)
Julia Price, it was a pleasure seeing you today. Today we talked about your vaginal symptoms. I did a vaginal exam and could not see anything that could be causing your symptoms. I will be running some tests to see if you have any infection. I will call you with the results. Please make an appointment to see your primary care physician in four weeks. Please encourage your partner to be tested for sexually transmitted infections. I have also refilled your prescription for your eczema cream.  If you have any questions or concerns, please do not hesitate to call the office at [phone].  Sincerely,  Jacquelin Hawkingalph Kareem Cathey, MD

## 2013-09-08 NOTE — Assessment & Plan Note (Signed)
Exam did not reveal etiology for pain. Will follow-up lab testing for possible infectious etiology (RPR, wet prep, urinalysis, HIV, GC/Chlamydia)

## 2013-09-12 ENCOUNTER — Telehealth: Payer: Self-pay | Admitting: Family Medicine

## 2013-09-12 NOTE — Telephone Encounter (Signed)
Patient informed of test results. No further intervention.

## 2014-06-12 ENCOUNTER — Ambulatory Visit (INDEPENDENT_AMBULATORY_CARE_PROVIDER_SITE_OTHER): Payer: Federal, State, Local not specified - PPO | Admitting: Family Medicine

## 2014-06-12 ENCOUNTER — Encounter: Payer: Self-pay | Admitting: Family Medicine

## 2014-06-12 VITALS — BP 116/78 | HR 72 | Temp 98.1°F | Ht 63.0 in | Wt 141.8 lb

## 2014-06-12 DIAGNOSIS — G43909 Migraine, unspecified, not intractable, without status migrainosus: Secondary | ICD-10-CM

## 2014-06-12 MED ORDER — DEXAMETHASONE SODIUM PHOSPHATE 10 MG/ML IJ SOLN
10.0000 mg | Freq: Once | INTRAMUSCULAR | Status: AC
  Administered 2014-06-12: 10 mg via INTRAMUSCULAR

## 2014-06-12 MED ORDER — SUMATRIPTAN SUCCINATE 6 MG/0.5ML ~~LOC~~ SOLN: 6.0000 mg | Freq: Once | SUBCUTANEOUS | Status: DC

## 2014-06-12 MED ORDER — SUMATRIPTAN SUCCINATE 6 MG/0.5ML ~~LOC~~ SOLN
6.0000 mg | Freq: Once | SUBCUTANEOUS | Status: AC
  Administered 2014-06-12: 6 mg via SUBCUTANEOUS

## 2014-06-12 MED ORDER — PROMETHAZINE HCL 25 MG/ML IJ SOLN
12.5000 mg | Freq: Once | INTRAMUSCULAR | Status: AC
Start: 2014-06-12 — End: 2014-06-12
  Administered 2014-06-12: 12.5 mg via INTRAMUSCULAR

## 2014-06-12 MED ORDER — BUTALBITAL-APAP-CAFFEINE 50-325-40 MG PO TABS: 1.0000 | ORAL_TABLET | Freq: Four times a day (QID) | ORAL | Status: DC | PRN

## 2014-06-12 NOTE — Patient Instructions (Signed)

## 2014-06-12 NOTE — Progress Notes (Signed)
   Subjective:    Patient ID: Julia Price, female    DOB: Sep 16, 1990, 23 y.o.   MRN: [id number]  Migraine  Associated symptoms include nausea. Pertinent negatives include no abdominal pain or coughing.   Migraine: rare migraines, last one was more than a year ago.  Currently +light sensitivity, +nausea, no neck pain, +HA  Review of Systems  Constitutional: Negative for chills and diaphoresis.  Respiratory: Negative for cough and shortness of breath.   Cardiovascular: Negative for leg swelling.  Gastrointestinal: Positive for nausea. Negative for abdominal pain, diarrhea, constipation and anal bleeding.  Endocrine: Negative for cold intolerance and heat intolerance.  Genitourinary: Negative for dysuria, urgency, decreased urine volume and difficulty urinating.  Neurological: Positive for headaches.       Objective:   Physical Exam  Constitutional: She is oriented to person, place, and time. She appears well-developed and well-nourished. She appears distressed (squinting, visible uncomfortable).  HENT:  Head: Normocephalic and atraumatic.  Eyes: Conjunctivae are normal. Pupils are equal, round, and reactive to light.  Neck: Normal range of motion. Neck supple. No thyromegaly present.  Cardiovascular: Normal rate.   Pulmonary/Chest: Effort normal. No respiratory distress.  Abdominal: Soft. She exhibits no distension. There is no tenderness.  Musculoskeletal: She exhibits no edema.  Neurological: She is alert and oriented to person, place, and time. No cranial nerve deficit.  Skin: Skin is warm and dry. No rash noted. She is not diaphoretic. No erythema.  Psychiatric: She has a normal mood and affect. Her behavior is normal.          Assessment & Plan:  Julia ButtonKarol was seen today for migraine.  Diagnoses and associated orders for this visit:  Migraine without status migrainosus, not intractable, unspecified migraine type - RX: butalbital-acetaminophen-caffeine (FIORICET)  50-325-40 MG per tablet; Take 1-2 tablets by mouth every 6 (six) hours as needed for headache. - dexamethasone (DECADRON) injection 10 mg; Inject 1 mL (10 mg total) into the muscle once. - promethazine (PHENERGAN) injection 12.5 mg; Inject 0.5 mLs (12.5 mg total) into the muscle once. - SUMAtriptan (IMITREX) injection 6 mg; Inject 0.5 mLs (6 mg total) into the skin once. - ER warning warnings  Perry MountACOSTA,Rasheka Denard ROCIO, MD 12:32 PM

## 2014-06-27 ENCOUNTER — Emergency Department (HOSPITAL_BASED_OUTPATIENT_CLINIC_OR_DEPARTMENT_OTHER)
Admission: EM | Admit: 2014-06-27 | Discharge: 2014-06-27 | Disposition: A | Discharge status: Home / Self Care | Payer: Federal, State, Local not specified - PPO | Attending: Emergency Medicine | Admitting: Emergency Medicine

## 2014-06-27 ENCOUNTER — Encounter (HOSPITAL_BASED_OUTPATIENT_CLINIC_OR_DEPARTMENT_OTHER): Payer: Self-pay | Admitting: Emergency Medicine

## 2014-06-27 DIAGNOSIS — R112 Nausea with vomiting, unspecified: Secondary | ICD-10-CM | POA: Insufficient documentation

## 2014-06-27 DIAGNOSIS — E860 Dehydration: Secondary | ICD-10-CM | POA: Insufficient documentation

## 2014-06-27 DIAGNOSIS — Z79899 Other long term (current) drug therapy: Secondary | ICD-10-CM | POA: Insufficient documentation

## 2014-06-27 DIAGNOSIS — Z7952 Long term (current) use of systemic steroids: Secondary | ICD-10-CM | POA: Diagnosis not present

## 2014-06-27 DIAGNOSIS — R1110 Vomiting, unspecified: Secondary | ICD-10-CM

## 2014-06-27 DIAGNOSIS — M1990 Unspecified osteoarthritis, unspecified site: Secondary | ICD-10-CM | POA: Diagnosis not present

## 2014-06-27 DIAGNOSIS — Z720 Tobacco use: Secondary | ICD-10-CM | POA: Diagnosis not present

## 2014-06-27 DIAGNOSIS — Z3202 Encounter for pregnancy test, result negative: Secondary | ICD-10-CM | POA: Insufficient documentation

## 2014-06-27 DIAGNOSIS — R197 Diarrhea, unspecified: Secondary | ICD-10-CM | POA: Diagnosis not present

## 2014-06-27 LAB — CBC WITH DIFFERENTIAL/PLATELET
BASOS ABS: 0 10*3/uL (ref 0.0–0.1)
Basophils Relative: 0 % (ref 0–1)
Eosinophils Absolute: 0 10*3/uL (ref 0.0–0.7)
Eosinophils Relative: 1 % (ref 0–5)
HEMATOCRIT: 41.3 % (ref 36.0–46.0)
Hemoglobin: 14.3 g/dL (ref 12.0–15.0)
LYMPHS PCT: 7 % — AB (ref 12–46)
Lymphs Abs: 0.6 10*3/uL — ABNORMAL LOW (ref 0.7–4.0)
MCH: 29.2 pg (ref 26.0–34.0)
MCHC: 34.6 g/dL (ref 30.0–36.0)
MCV: 84.3 fL (ref 78.0–100.0)
Monocytes Absolute: 0.5 10*3/uL (ref 0.1–1.0)
Monocytes Relative: 7 % (ref 3–12)
NEUTROS ABS: 7.2 10*3/uL (ref 1.7–7.7)
Neutrophils Relative %: 85 % — ABNORMAL HIGH (ref 43–77)
PLATELETS: 246 10*3/uL (ref 150–400)
RBC: 4.9 MIL/uL (ref 3.87–5.11)
RDW: 12.8 % (ref 11.5–15.5)
WBC: 8.4 10*3/uL (ref 4.0–10.5)

## 2014-06-27 LAB — COMPREHENSIVE METABOLIC PANEL
ALT: 14 U/L (ref 0–35)
AST: 18 U/L (ref 0–37)
Albumin: 4 g/dL (ref 3.5–5.2)
Alkaline Phosphatase: 44 U/L (ref 39–117)
Anion gap: 18 — ABNORMAL HIGH (ref 5–15)
BILIRUBIN TOTAL: 0.4 mg/dL (ref 0.3–1.2)
BUN: 11 mg/dL (ref 6–23)
CHLORIDE: 103 meq/L (ref 96–112)
CO2: 19 mEq/L (ref 19–32)
Calcium: 9.3 mg/dL (ref 8.4–10.5)
Creatinine, Ser: 0.7 mg/dL (ref 0.50–1.10)
GFR calc Af Amer: >90 mL/min (ref >90)
GFR calc non Af Amer: >90 mL/min (ref >90)
Glucose, Bld: 122 mg/dL — ABNORMAL HIGH (ref 70–99)
Potassium: 4 mEq/L (ref 3.7–5.3)
Sodium: 140 mEq/L (ref 137–147)
Total Protein: 7.3 g/dL (ref 6.0–8.3)

## 2014-06-27 LAB — PREGNANCY, URINE: PREG TEST UR: NEGATIVE

## 2014-06-27 LAB — URINALYSIS, ROUTINE W REFLEX MICROSCOPIC
Bilirubin Urine: NEGATIVE
GLUCOSE, UA: NEGATIVE mg/dL
HGB URINE DIPSTICK: NEGATIVE
KETONES UR: NEGATIVE mg/dL
Leukocytes, UA: NEGATIVE
Nitrite: NEGATIVE
Protein, ur: NEGATIVE mg/dL
Specific Gravity, Urine: 1.022 (ref 1.005–1.030)
Urobilinogen, UA: 0.2 mg/dL (ref 0.0–1.0)
pH: 8 (ref 5.0–8.0)

## 2014-06-27 MED ORDER — TRAMADOL HCL 50 MG PO TABS: 50.0000 mg | ORAL_TABLET | Freq: Four times a day (QID) | ORAL | Status: DC | PRN

## 2014-06-27 MED ORDER — ONDANSETRON HCL 4 MG/2ML IJ SOLN
4.0000 mg | Freq: Once | INTRAMUSCULAR | Status: AC
  Administered 2014-06-27: 4 mg via INTRAVENOUS
  Filled 2014-06-27: qty 2

## 2014-06-27 MED ORDER — PROMETHAZINE HCL 25 MG PO TABS: 25.0000 mg | ORAL_TABLET | Freq: Four times a day (QID) | ORAL | Status: DC | PRN

## 2014-06-27 MED ORDER — SODIUM CHLORIDE 0.9 % IV BOLUS (SEPSIS)
1000.0000 mL | Freq: Once | INTRAVENOUS | Status: AC
  Administered 2014-06-27: 1000 mL via INTRAVENOUS

## 2014-06-27 MED ORDER — KETOROLAC TROMETHAMINE 30 MG/ML IJ SOLN
30.0000 mg | Freq: Once | INTRAMUSCULAR | Status: AC
  Administered 2014-06-27: 30 mg via INTRAVENOUS
  Filled 2014-06-27: qty 1

## 2014-06-27 MED ORDER — DIPHENOXYLATE-ATROPINE 2.5-0.025 MG PO TABS
2.0000 | ORAL_TABLET | Freq: Four times a day (QID) | ORAL | Status: DC | PRN
Start: 2014-06-27 — End: 2015-05-24

## 2014-06-27 NOTE — ED Notes (Signed)
Patient started vomiting last night around 3am & some diarrhea

## 2014-06-27 NOTE — Discharge Instructions (Signed)

## 2014-06-27 NOTE — ED Provider Notes (Addendum)
CSN: [id number]     Arrival date & time 06/27/14  0710 History   None    Chief Complaint  Patient presents with  . Emesis     (Consider location/radiation/quality/duration/timing/severity/associated sxs/prior Treatment) HPI Comments: Patient presents to the ER for evaluation of nausea, vomiting and diarrhea. Symptoms began approximately 3 AM. Patient reports repeated vomiting and only a small amount of watery diarrhea. She has not had any fever. There is no sore throat, cough, upper respiratory symptoms. She has felt weak and dizzy when she tries to walk. There is no reported a mild global headache. No neck pain or stiffness. Patient reports that multiple family members have had similar symptoms over the past several weeks.  Patient is a 23 y.o. female presenting with vomiting.  Emesis Associated symptoms: diarrhea     Past Medical History  Diagnosis Date  . Arthritis    No past surgical history on file. No family history on file. History  Substance Use Topics  . Smoking status: Current Every Day Smoker -- 1.00 packs/day    Types: Cigarettes  . Smokeless tobacco: Not on file  . Alcohol Use: Yes   OB History   Grav Para Term Preterm Abortions TAB SAB Ect Mult Living                 Review of Systems  Gastrointestinal: Positive for nausea, vomiting and diarrhea.  All other systems reviewed and are negative.     Allergies  Review of patient's allergies indicates no known allergies.  Home Medications   Prior to Admission medications   Medication Sig Start Date End Date Taking? Authorizing Provider  acetaminophen (TYLENOL) 500 MG tablet Take 1,000 mg by mouth every 6 (six) hours as needed. For pain.     Historical Provider, MD  butalbital-acetaminophen-caffeine (FIORICET) 50-325-40 MG per tablet Take 1-2 tablets by mouth every 6 (six) hours as needed for headache. 06/12/14 06/12/15  Perry MountKristy Rocio Acosta, MD  Calcium Carbonate Antacid (CHILDRENS PEPTO) 400 MG CHEW Chew 2  tablets by mouth daily as needed. For stomach pain.     Historical Provider, MD  ibuprofen (ADVIL,MOTRIN) 200 MG tablet Take 800-1,200 mg by mouth every 8 (eight) hours as needed. For pain.     Historical Provider, MD  omeprazole (PRILOSEC) 40 MG capsule Take 1 capsule (40 mg total) by mouth daily. 12/06/11 12/05/12  Tobey GrimJeffrey H Walden, MD  triamcinolone ointment (KENALOG) 0.1 % APPLY TOPICALLY TO THE AFFECTED AREA TWICE DAILY 09/08/13   Jacquelin Hawkingalph Nettey, MD   BP 103/71  Pulse 101  Temp(Src) 98.3 F (36.8 C) (Oral)  Resp 18  Ht 5\' 3"  (1.6 m)  SpO2 99%  LMP 06/07/2014 Physical Exam  Constitutional: She is oriented to person, place, and time. She appears well-developed and well-nourished. No distress.  HENT:  Head: Normocephalic and atraumatic.  Right Ear: Hearing normal.  Left Ear: Hearing normal.  Nose: Nose normal.  Mouth/Throat: Oropharynx is clear and moist and mucous membranes are normal.  Eyes: Conjunctivae and EOM are normal. Pupils are equal, round, and reactive to light.  Neck: Normal range of motion. Neck supple. No Brudzinski's sign and no Kernig's sign noted.  Cardiovascular: Regular rhythm, S1 normal and S2 normal.  Exam reveals no gallop and no friction rub.   No murmur heard. Pulmonary/Chest: Effort normal and breath sounds normal. No respiratory distress. She exhibits no tenderness.  Abdominal: Soft. Normal appearance and bowel sounds are normal. There is no hepatosplenomegaly. There is no tenderness. There is  no rebound, no guarding, no tenderness at McBurney's point and negative Murphy's sign. No hernia.  Musculoskeletal: Normal range of motion.  Neurological: She is alert and oriented to person, place, and time. She has normal strength. No cranial nerve deficit or sensory deficit. Coordination normal. GCS eye subscore is 4. GCS verbal subscore is 5. GCS motor subscore is 6.  Skin: Skin is warm, dry and intact. No rash noted. No cyanosis.  Psychiatric: She has a normal mood and  affect. Her speech is normal and behavior is normal. Thought content normal.    ED Course  Procedures (including critical care time) Labs Review Labs Reviewed  CBC WITH DIFFERENTIAL  COMPREHENSIVE METABOLIC PANEL  URINALYSIS, ROUTINE W REFLEX MICROSCOPIC  PREGNANCY, URINE    Imaging Review No results found.   EKG Interpretation None      MDM   Final diagnoses:  None   vomiting Diarrhea Dehydration  Patient presents to the ER for evaluation of nausea, vomiting and diarrhea. Patient has multiple sick contacts with similar symptoms. She did not have any abdominal pain or tenderness. Exam was benign. Patient did have some mild hypotension on arrival with a tachycardia. This was consistent with mild dehydration. Patient treated with IV fluids and Zofran with improvement.    Gilda Creasehristopher J. Pollina, MD 06/27/14 0725  Gilda Creasehristopher J. Pollina, MD 06/27/14 [phone]

## 2014-07-27 ENCOUNTER — Ambulatory Visit (INDEPENDENT_AMBULATORY_CARE_PROVIDER_SITE_OTHER): Payer: Federal, State, Local not specified - PPO | Admitting: Family Medicine

## 2014-07-27 ENCOUNTER — Encounter: Payer: Self-pay | Admitting: Family Medicine

## 2014-07-27 VITALS — BP 127/80 | HR 103 | Temp 98.3°F | Ht 63.0 in | Wt 144.6 lb

## 2014-07-27 DIAGNOSIS — J209 Acute bronchitis, unspecified: Secondary | ICD-10-CM | POA: Diagnosis not present

## 2014-07-27 NOTE — Patient Instructions (Signed)
You have bronchitis which has to be treated symptomatically so continue using over the counter medications as directed and return if your symptoms worsen.   Make a follow up appointment with your primary care doctor to discuss smoking and birth control.

## 2014-07-27 NOTE — Assessment & Plan Note (Signed)
Suspect viral etiology. Supportive treatment recommended. TRC criteria reviewed. Spent much of visit discussing smoking cessation. She is to follow up with PCP regarding this.

## 2014-07-27 NOTE — Progress Notes (Signed)
   Subjective:  Satira SarkKarol Chilcote is a 23 y.o. female presenting to same day clinic for COUGH  Started feeling ill, throat itching/pain/soreness 4 days ago then coughing 2 days ago. NyQuil helped. Theraflu and alkaseltzer gave a little relief but symptoms are getting overall worse. This is interfering with her work causing her to miss work yesterday.   Runny nose: no Mucous in back of throat: no Throat burning or reflux: no Wheezing or asthma: no Fever: no Chest Pain: no Shortness of breath: no Leg swelling: no Hemoptysis: no Weight loss: no  - Review of Systems: Per HPI.  - Smoking status noted Objective:  BP 127/80 mmHg  Pulse 103  Temp(Src) 98.3 F (36.8 C) (Oral)  Ht 5\' 3"  (1.6 m)  Wt 144 lb 9.6 oz (65.59 kg)  BMI 25.62 kg/m2  LMP 07/25/2014 (Exact Date) Gen:  23 y.o. female in no distress smelling faintly of cigarette smoke. HEENT: Normocephalic, sclerae clear, conjunctivae normal, pupils equal and reactive, nares normal, moist mucous membranes, posterior oropharynx clear and erythematous without tonsillomegaly or exudates. No lymphadenopathy. CV: Regular rate, no murmur; radial, DP and PT pulses 2+ bilaterally, cap refill < 2 sec. Pulm: Non-labored breathing ambient air; CTAB, no wheezes or crackles. Assessment/Plan:  Satira SarkKarol Hasten is a 23 y.o. female here for bronchitis, likely viral.

## 2014-08-01 ENCOUNTER — Emergency Department (HOSPITAL_BASED_OUTPATIENT_CLINIC_OR_DEPARTMENT_OTHER)
Admission: EM | Admit: 2014-08-01 | Discharge: 2014-08-01 | Disposition: A | Discharge status: Home / Self Care | Payer: Federal, State, Local not specified - PPO | Attending: Emergency Medicine | Admitting: Emergency Medicine

## 2014-08-01 ENCOUNTER — Encounter (HOSPITAL_BASED_OUTPATIENT_CLINIC_OR_DEPARTMENT_OTHER): Payer: Self-pay | Admitting: Emergency Medicine

## 2014-08-01 ENCOUNTER — Emergency Department (HOSPITAL_BASED_OUTPATIENT_CLINIC_OR_DEPARTMENT_OTHER): Payer: Federal, State, Local not specified - PPO

## 2014-08-01 DIAGNOSIS — Z3202 Encounter for pregnancy test, result negative: Secondary | ICD-10-CM | POA: Diagnosis not present

## 2014-08-01 DIAGNOSIS — Z720 Tobacco use: Secondary | ICD-10-CM | POA: Insufficient documentation

## 2014-08-01 DIAGNOSIS — M1990 Unspecified osteoarthritis, unspecified site: Secondary | ICD-10-CM | POA: Insufficient documentation

## 2014-08-01 DIAGNOSIS — Z79899 Other long term (current) drug therapy: Secondary | ICD-10-CM | POA: Diagnosis not present

## 2014-08-01 DIAGNOSIS — R05 Cough: Secondary | ICD-10-CM | POA: Diagnosis present

## 2014-08-01 DIAGNOSIS — J069 Acute upper respiratory infection, unspecified: Secondary | ICD-10-CM | POA: Diagnosis not present

## 2014-08-01 DIAGNOSIS — R51 Headache: Secondary | ICD-10-CM | POA: Insufficient documentation

## 2014-08-01 DIAGNOSIS — Z7952 Long term (current) use of systemic steroids: Secondary | ICD-10-CM | POA: Diagnosis not present

## 2014-08-01 LAB — PREGNANCY, URINE: Preg Test, Ur: NEGATIVE

## 2014-08-01 NOTE — ED Notes (Signed)
PT presents to ED with complaints of cough and headache for a week and a half.

## 2014-08-01 NOTE — ED Provider Notes (Signed)
CSN: [id number]     Arrival date & time 08/01/14  1314 History   First MD Initiated Contact with Patient 08/01/14 1337     Chief Complaint  Patient presents with  . Cough  . Headache     (Consider location/radiation/quality/duration/timing/severity/associated sxs/prior Treatment) HPI  23 year old female presents with 2 weeks of cough, congestion, and headache. This started at Thanksgiving. She initially felt like she had subjective fevers but those are gone. Has not had any muscle aches. The headache seemed to come and go. The cough is mostly dry but occasionally has some yellow sputum. Gets short of breath if she is talking for a long time but otherwise has not had a trouble breathing. The patient has felt a little bit of a sore throat. Has been trying Alka-Seltzer and Aleve with moderate relief. Has been trying NyQuil for the cough. Feels chest pain when coughing. Occasionally feels nauseous but denies any abdominal pain, dysuria, or missed menstrual period. She was recently had her menstrual period over the last week. Friend/significant other is coming down with similar symptoms. Saw PCP earlier and was diagnosed with bronchitis.  Past Medical History  Diagnosis Date  . Arthritis    History reviewed. No pertinent past surgical history. No family history on file. History  Substance Use Topics  . Smoking status: Current Every Day Smoker -- 1.00 packs/day    Types: Cigarettes  . Smokeless tobacco: Not on file  . Alcohol Use: Yes   OB History    No data available     Review of Systems  Constitutional: Positive for chills.  HENT: Positive for congestion and sore throat.   Respiratory: Positive for cough. Negative for shortness of breath.   Gastrointestinal: Positive for nausea. Negative for vomiting and abdominal pain.  Genitourinary: Negative for dysuria.  Neurological: Positive for headaches.  All other systems reviewed and are negative.     Allergies  Review of patient's  allergies indicates no known allergies.  Home Medications   Prior to Admission medications   Medication Sig Start Date End Date Taking? Authorizing Provider  acetaminophen (TYLENOL) 500 MG tablet Take 1,000 mg by mouth every 6 (six) hours as needed. For pain.     Historical Provider, MD  butalbital-acetaminophen-caffeine (FIORICET) 50-325-40 MG per tablet Take 1-2 tablets by mouth every 6 (six) hours as needed for headache. 06/12/14 06/12/15  Perry MountKristy Rocio Acosta, MD  Calcium Carbonate Antacid (CHILDRENS PEPTO) 400 MG CHEW Chew 2 tablets by mouth daily as needed. For stomach pain.     Historical Provider, MD  diphenoxylate-atropine (LOMOTIL) 2.5-0.025 MG per tablet Take 2 tablets by mouth 4 (four) times daily as needed for diarrhea or loose stools. 06/27/14   Gilda Creasehristopher J. Pollina, MD  ibuprofen (ADVIL,MOTRIN) 200 MG tablet Take 800-1,200 mg by mouth every 8 (eight) hours as needed. For pain.     Historical Provider, MD  omeprazole (PRILOSEC) 40 MG capsule Take 1 capsule (40 mg total) by mouth daily. 12/06/11 12/05/12  Tobey GrimJeffrey H Walden, MD  promethazine (PHENERGAN) 25 MG tablet Take 1 tablet (25 mg total) by mouth every 6 (six) hours as needed for nausea or vomiting. 06/27/14   Gilda Creasehristopher J. Pollina, MD  traMADol (ULTRAM) 50 MG tablet Take 1 tablet (50 mg total) by mouth every 6 (six) hours as needed. 06/27/14   Gilda Creasehristopher J. Pollina, MD  triamcinolone ointment (KENALOG) 0.1 % APPLY TOPICALLY TO THE AFFECTED AREA TWICE DAILY 09/08/13   Jacquelin Hawkingalph Nettey, MD   BP 119/62 mmHg  Pulse  78  Temp(Src) 98.4 F (36.9 C) (Oral)  Resp 18  Ht 5\' 3"  (1.6 m)  Wt 144 lb (65.318 kg)  BMI 25.51 kg/m2  SpO2 100%  LMP 07/25/2014 (Exact Date) Physical Exam  Constitutional: She is oriented to person, place, and time. She appears well-developed and well-nourished.  HENT:  Head: Normocephalic and atraumatic.  Right Ear: External ear normal.  Left Ear: External ear normal.  Nose: Nose normal.  Mouth/Throat:  Oropharynx is clear and moist. No oropharyngeal exudate.  Eyes: Right eye exhibits no discharge. Left eye exhibits no discharge.  Neck: Neck supple.  Cardiovascular: Normal rate, regular rhythm and normal heart sounds.   Pulmonary/Chest: Effort normal and breath sounds normal. She has no wheezes. She has no rales.  Abdominal: Soft. She exhibits no distension. There is no tenderness.  Neurological: She is alert and oriented to person, place, and time.  Skin: Skin is warm and dry.  Nursing note and vitals reviewed.   ED Course  Procedures (including critical care time) Labs Review Labs Reviewed  PREGNANCY, URINE    Imaging Review Dg Chest 2 View  08/01/2014   CLINICAL DATA:  Cough and headache for a week and half  EXAM: CHEST  2 VIEW  COMPARISON:  None.  FINDINGS: The heart size and mediastinal contours are within normal limits. Both lungs are clear. The visualized skeletal structures are unremarkable.  IMPRESSION: No active cardiopulmonary disease.   Electronically Signed   By: Maryclare BeanArt  Hoss M.D.   On: 08/01/2014 14:28     EKG Interpretation None      MDM   Final diagnoses:  Upper respiratory infection    Patient symptoms are consistent with a viral upper respiratory infection. She's not have any evidence of pneumonia or bacterial illness. She has normal work of breathing and normal O2 saturations. I have recommended symptomatic care, such as Mucinex, Robitussin, saline decongestants, etc. Will have her follow-up with her PCP and return to ER if symptoms worsen.    Audree CamelScott T Karna Abed, MD 08/01/14 [phone]

## 2014-08-01 NOTE — Discharge Instructions (Signed)
Cough, Adult ° A cough is a reflex that helps clear your throat and airways. It can help heal the body or may be a reaction to an irritated airway. A cough may only last 2 or 3 weeks (acute) or may last more than 8 weeks (chronic).  °CAUSES °Acute cough: °· Viral or bacterial infections. °Chronic cough: °· Infections. °· Allergies. °· Asthma. °· Post-nasal drip. °· Smoking. °· Heartburn or acid reflux. °· Some medicines. °· Chronic lung problems (COPD). °· Cancer. °SYMPTOMS  °· Cough. °· Fever. °· Chest pain. °· Increased breathing rate. °· High-pitched whistling sound when breathing (wheezing). °· Colored mucus that you cough up (sputum). °TREATMENT  °· A bacterial cough may be treated with antibiotic medicine. °· A viral cough must run its course and will not respond to antibiotics. °· Your caregiver may recommend other treatments if you have a chronic cough. °HOME CARE INSTRUCTIONS  °· Only take over-the-counter or prescription medicines for pain, discomfort, or fever as directed by your caregiver. Use cough suppressants only as directed by your caregiver. °· Use a cold steam vaporizer or humidifier in your bedroom or home to help loosen secretions. °· Sleep in a semi-upright position if your cough is worse at night. °· Rest as needed. °· Stop smoking if you smoke. °SEEK IMMEDIATE MEDICAL CARE IF:  °· You have pus in your sputum. °· Your cough starts to worsen. °· You cannot control your cough with suppressants and are losing sleep. °· You begin coughing up blood. °· You have difficulty breathing. °· You develop pain which is getting worse or is uncontrolled with medicine. °· You have a fever. °MAKE SURE YOU:  °· Understand these instructions. °· Will watch your condition. °· Will get help right away if you are not doing well or get worse. °Document Released: 02/10/2011 Document Revised: 11/06/2011 Document Reviewed: 02/10/2011 °ExitCare® Patient Information ©2015 [employer]. This information is not intended  to replace advice given to you by your health care provider. Make sure you discuss any questions you have with your health care provider. °Upper Respiratory Infection, Adult °An upper respiratory infection (URI) is also sometimes known as the common cold. The upper respiratory tract includes the nose, sinuses, throat, trachea, and bronchi. Bronchi are the airways leading to the lungs. Most people improve within 1 week, but symptoms can last up to 2 weeks. A residual cough may last even longer.  °CAUSES °Many different viruses can infect the tissues lining the upper respiratory tract. The tissues become irritated and inflamed and often become very moist. Mucus production is also common. A cold is contagious. You can easily spread the virus to others by oral contact. This includes kissing, sharing a glass, coughing, or sneezing. Touching your mouth or nose and then touching a surface, which is then touched by another person, can also spread the virus. °SYMPTOMS  °Symptoms typically develop 1 to 3 days after you come in contact with a cold virus. Symptoms vary from person to person. They may include: °· Runny nose. °· Sneezing. °· Nasal congestion. °· Sinus irritation. °· Sore throat. °· Loss of voice (laryngitis). °· Cough. °· Fatigue. °· Muscle aches. °· Loss of appetite. °· Headache. °· Low-grade fever. °DIAGNOSIS  °You might diagnose your own cold based on familiar symptoms, since most people get a cold 2 to 3 times a year. Your caregiver can confirm this based on your exam. Most importantly, your caregiver can check that your symptoms are not due to another disease such   as strep throat, sinusitis, pneumonia, asthma, or epiglottitis. Blood tests, throat tests, and X-rays are not necessary to diagnose a common cold, but they may sometimes be helpful in excluding other more serious diseases. Your caregiver will decide if any further tests are required. °RISKS AND COMPLICATIONS  °You may be at risk for a more severe  case of the common cold if you smoke cigarettes, have chronic heart disease (such as heart failure) or lung disease (such as asthma), or if you have a weakened immune system. The very young and very old are also at risk for more serious infections. Bacterial sinusitis, middle ear infections, and bacterial pneumonia can complicate the common cold. The common cold can worsen asthma and chronic obstructive pulmonary disease (COPD). Sometimes, these complications can require emergency medical care and may be life-threatening. °PREVENTION  °The best way to protect against getting a cold is to practice good hygiene. Avoid oral or hand contact with people with cold symptoms. Wash your hands often if contact occurs. There is no clear evidence that vitamin C, vitamin E, echinacea, or exercise reduces the chance of developing a cold. However, it is always recommended to get plenty of rest and practice good nutrition. °TREATMENT  °Treatment is directed at relieving symptoms. There is no cure. Antibiotics are not effective, because the infection is caused by a virus, not by bacteria. Treatment may include: °· Increased fluid intake. Sports drinks offer valuable electrolytes, sugars, and fluids. °· Breathing heated mist or steam (vaporizer or shower). °· Eating chicken soup or other clear broths, and maintaining good nutrition. °· Getting plenty of rest. °· Using gargles or lozenges for comfort. °· Controlling fevers with ibuprofen or acetaminophen as directed by your caregiver. °· Increasing usage of your inhaler if you have asthma. °Zinc gel and zinc lozenges, taken in the first 24 hours of the common cold, can shorten the duration and lessen the severity of symptoms. Pain medicines may help with fever, muscle aches, and throat pain. A variety of non-prescription medicines are available to treat congestion and runny nose. Your caregiver can make recommendations and may suggest nasal or lung inhalers for other symptoms.  °HOME  CARE INSTRUCTIONS  °· Only take over-the-counter or prescription medicines for pain, discomfort, or fever as directed by your caregiver. °· Use a warm mist humidifier or inhale steam from a shower to increase air moisture. This may keep secretions moist and make it easier to breathe. °· Drink enough water and fluids to keep your urine clear or pale yellow. °· Rest as needed. °· Return to work when your temperature has returned to normal or as your caregiver advises. You may need to stay home longer to avoid infecting others. You can also use a face mask and careful hand washing to prevent spread of the virus. °SEEK MEDICAL CARE IF:  °· After the first few days, you feel you are getting worse rather than better. °· You need your caregiver's advice about medicines to control symptoms. °· You develop chills, worsening shortness of breath, or brown or red sputum. These may be signs of pneumonia. °· You develop yellow or brown nasal discharge or pain in the face, especially when you bend forward. These may be signs of sinusitis. °· You develop a fever, swollen neck glands, pain with swallowing, or white areas in the back of your throat. These may be signs of strep throat. °SEEK IMMEDIATE MEDICAL CARE IF:  °· You have a fever. °· You develop severe or persistent headache, ear   pain, sinus pain, or chest pain. °· You develop wheezing, a prolonged cough, cough up blood, or have a change in your usual mucus (if you have chronic lung disease). °· You develop sore muscles or a stiff neck. °Document Released: 02/07/2001 Document Revised: 11/06/2011 Document Reviewed: 11/19/2013 °ExitCare® Patient Information ©2015 [employer]. This information is not intended to replace advice given to you by your health care provider. Make sure you discuss any questions you have with your health care provider. ° °

## 2014-08-01 NOTE — ED Notes (Signed)
Patient transported to X-ray 

## 2014-12-08 ENCOUNTER — Emergency Department (HOSPITAL_BASED_OUTPATIENT_CLINIC_OR_DEPARTMENT_OTHER)
Admission: EM | Admit: 2014-12-08 | Discharge: 2014-12-08 | Disposition: A | Discharge status: Home / Self Care | Payer: Federal, State, Local not specified - PPO | Attending: Emergency Medicine | Admitting: Emergency Medicine

## 2014-12-08 ENCOUNTER — Encounter (HOSPITAL_BASED_OUTPATIENT_CLINIC_OR_DEPARTMENT_OTHER): Payer: Self-pay | Admitting: *Deleted

## 2014-12-08 DIAGNOSIS — R112 Nausea with vomiting, unspecified: Secondary | ICD-10-CM

## 2014-12-08 DIAGNOSIS — M1990 Unspecified osteoarthritis, unspecified site: Secondary | ICD-10-CM | POA: Insufficient documentation

## 2014-12-08 DIAGNOSIS — Z3202 Encounter for pregnancy test, result negative: Secondary | ICD-10-CM | POA: Diagnosis not present

## 2014-12-08 DIAGNOSIS — Z792 Long term (current) use of antibiotics: Secondary | ICD-10-CM | POA: Insufficient documentation

## 2014-12-08 DIAGNOSIS — Z79899 Other long term (current) drug therapy: Secondary | ICD-10-CM | POA: Diagnosis not present

## 2014-12-08 DIAGNOSIS — K226 Gastro-esophageal laceration-hemorrhage syndrome: Secondary | ICD-10-CM | POA: Diagnosis not present

## 2014-12-08 LAB — COMPREHENSIVE METABOLIC PANEL
ALK PHOS: 43 U/L (ref 39–117)
ALT: 22 U/L (ref 0–35)
AST: 30 U/L (ref 0–37)
Albumin: 4.2 g/dL (ref 3.5–5.2)
Anion gap: 11 (ref 5–15)
BUN: 14 mg/dL (ref 6–23)
CO2: 21 mmol/L (ref 19–32)
Calcium: 8.9 mg/dL (ref 8.4–10.5)
Chloride: 108 mmol/L (ref 96–112)
Creatinine, Ser: 0.65 mg/dL (ref 0.50–1.10)
GFR calc non Af Amer: >90 mL/min (ref >90)
GLUCOSE: 108 mg/dL — AB (ref 70–99)
POTASSIUM: 3.3 mmol/L — AB (ref 3.5–5.1)
Sodium: 140 mmol/L (ref 135–145)
TOTAL PROTEIN: 7.6 g/dL (ref 6.0–8.3)
Total Bilirubin: 0.6 mg/dL (ref 0.3–1.2)

## 2014-12-08 LAB — CBC WITH DIFFERENTIAL/PLATELET
BASOS ABS: 0.1 10*3/uL (ref 0.0–0.1)
BASOS PCT: 1 % (ref 0–1)
Eosinophils Absolute: 0 10*3/uL (ref 0.0–0.7)
Eosinophils Relative: 0 % (ref 0–5)
HCT: 37.5 % (ref 36.0–46.0)
HEMOGLOBIN: 12.9 g/dL (ref 12.0–15.0)
LYMPHS PCT: 23 % (ref 12–46)
Lymphs Abs: 1.3 10*3/uL (ref 0.7–4.0)
MCH: 28.4 pg (ref 26.0–34.0)
MCHC: 34.4 g/dL (ref 30.0–36.0)
MCV: 82.6 fL (ref 78.0–100.0)
MONO ABS: 0.4 10*3/uL (ref 0.1–1.0)
Monocytes Relative: 7 % (ref 3–12)
NEUTROS ABS: 3.9 10*3/uL (ref 1.7–7.7)
NEUTROS PCT: 69 % (ref 43–77)
Platelets: 264 10*3/uL (ref 150–400)
RBC: 4.54 MIL/uL (ref 3.87–5.11)
RDW: 12.6 % (ref 11.5–15.5)
WBC: 5.7 10*3/uL (ref 4.0–10.5)

## 2014-12-08 LAB — LIPASE, BLOOD: Lipase: 26 U/L (ref 11–59)

## 2014-12-08 LAB — PREGNANCY, URINE: Preg Test, Ur: NEGATIVE

## 2014-12-08 MED ORDER — PANTOPRAZOLE SODIUM 40 MG PO TBEC: 40.0000 mg | DELAYED_RELEASE_TABLET | Freq: Two times a day (BID) | ORAL | Status: DC

## 2014-12-08 MED ORDER — ONDANSETRON HCL 8 MG PO TABS: 8.0000 mg | ORAL_TABLET | ORAL | Status: DC | PRN

## 2014-12-08 MED ORDER — POTASSIUM CHLORIDE CRYS ER 20 MEQ PO TBCR
40.0000 meq | EXTENDED_RELEASE_TABLET | Freq: Once | ORAL | Status: AC
  Administered 2014-12-08: 40 meq via ORAL
  Filled 2014-12-08: qty 2

## 2014-12-08 MED ORDER — SODIUM CHLORIDE 0.9 % IV BOLUS (SEPSIS)
1000.0000 mL | Freq: Once | INTRAVENOUS | Status: AC
  Administered 2014-12-08: 1000 mL via INTRAVENOUS

## 2014-12-08 MED ORDER — ONDANSETRON HCL 4 MG/2ML IJ SOLN
4.0000 mg | Freq: Once | INTRAMUSCULAR | Status: AC
  Administered 2014-12-08: 4 mg via INTRAVENOUS
  Filled 2014-12-08: qty 2

## 2014-12-08 NOTE — ED Notes (Signed)
Patient states she has had a decreased appetite yesterday all day, and only ate at breakfast.  States this morning around 0100, she woke up because she did not feel good, and started vomiting.  States she has vomited so much that she now is vomiting blood.  Denies pain.

## 2014-12-08 NOTE — ED Provider Notes (Signed)
CSN: [id number]     Arrival date & time 12/08/14  0736 History   First MD Initiated Contact with Patient 12/08/14 0737     Chief Complaint  Patient presents with  . Emesis     (Consider location/radiation/quality/duration/timing/severity/associated sxs/prior Treatment) HPI  24 year old female presents with vomiting since around 1 AM. After about 2 hours of frequent vomiting she has noticed that she is now vomiting blood. She states that she is vomiting "pure blood". Her throat feels sore but she denies any abdominal pain, chest pain, or shortness of breath. No fevers. Felt tired and ill yesterday and did not eat much. But did not have any vomiting or diarrhea. Has not had any diarrhea at this time. Recently around sick contacts that had vomiting and diarrhea but states she was in her on the long. Patient feels chills but no fevers. No urinary symptoms.  Past Medical History  Diagnosis Date  . Arthritis    History reviewed. No pertinent past surgical history. No family history on file. History  Substance Use Topics  . Smoking status: Former Smoker -- 1.00 packs/day    Types: Cigarettes    Quit date: 08/28/2014  . Smokeless tobacco: Not on file  . Alcohol Use: Yes   OB History    No data available     Review of Systems  Constitutional: Positive for chills. Negative for fever.  Respiratory: Negative for cough and shortness of breath.   Cardiovascular: Negative for chest pain.  Gastrointestinal: Positive for nausea and vomiting. Negative for abdominal pain and diarrhea.  Genitourinary: Negative for dysuria.  All other systems reviewed and are negative.     Allergies  Review of patient's allergies indicates no known allergies.  Home Medications   Prior to Admission medications   Medication Sig Start Date End Date Taking? Authorizing Provider  acetaminophen (TYLENOL) 500 MG tablet Take 1,000 mg by mouth every 6 (six) hours as needed. For pain.    Yes Historical Provider, MD   butalbital-acetaminophen-caffeine (FIORICET) 50-325-40 MG per tablet Take 1-2 tablets by mouth every 6 (six) hours as needed for headache. 06/12/14 06/12/15 Yes Fredirick Lathe, MD  Calcium Carbonate Antacid (CHILDRENS PEPTO) 400 MG CHEW Chew 2 tablets by mouth daily as needed. For stomach pain.    Yes Historical Provider, MD  ibuprofen (ADVIL,MOTRIN) 200 MG tablet Take 800-1,200 mg by mouth every 8 (eight) hours as needed. For pain.    Yes Historical Provider, MD  diphenoxylate-atropine (LOMOTIL) 2.5-0.025 MG per tablet Take 2 tablets by mouth 4 (four) times daily as needed for diarrhea or loose stools. 06/27/14   Gilda Crease, MD  omeprazole (PRILOSEC) 40 MG capsule Take 1 capsule (40 mg total) by mouth daily. 12/06/11 12/05/12  Tobey Grim, MD  promethazine (PHENERGAN) 25 MG tablet Take 1 tablet (25 mg total) by mouth every 6 (six) hours as needed for nausea or vomiting. 06/27/14   Gilda Crease, MD  traMADol (ULTRAM) 50 MG tablet Take 1 tablet (50 mg total) by mouth every 6 (six) hours as needed. 06/27/14   Gilda Crease, MD  triamcinolone ointment (KENALOG) 0.1 % APPLY TOPICALLY TO THE AFFECTED AREA TWICE DAILY 09/08/13   Narda Bonds, MD   BP 101/62 mmHg  Pulse 82  Temp(Src) 98.1 F (36.7 C) (Oral)  Resp 22  Ht  (1.6 m)  SpO2 100% Physical Exam  Constitutional: She is oriented to person, place, and time. She appears well-developed and well-nourished. No distress.  HENT:  Head: Normocephalic and atraumatic.  Right Ear: External ear normal.  Left Ear: External ear normal.  Nose: Nose normal.  Eyes: Right eye exhibits no discharge. Left eye exhibits no discharge.  Cardiovascular: Normal rate, regular rhythm and normal heart sounds.   Pulmonary/Chest: Effort normal and breath sounds normal. She exhibits no tenderness.  No crepitus  Abdominal: Soft. She exhibits no distension. There is no tenderness.  Neurological: She is alert and oriented to person,  place, and time.  Skin: Skin is warm and dry. She is not diaphoretic.  Nursing note and vitals reviewed.   ED Course  Procedures (including critical care time) Labs Review Labs Reviewed  COMPREHENSIVE METABOLIC PANEL - Abnormal; Notable for the following:    Potassium 3.3 (*)    Glucose, Bld 108 (*)    All other components within normal limits  LIPASE, BLOOD  CBC WITH DIFFERENTIAL/PLATELET  PREGNANCY, URINE    Imaging Review No results found.   EKG Interpretation None      MDM   Final diagnoses:  Nausea and vomiting in adult  Mallory-Weiss tear    Patient feels improved with fluids and Zofran. She does have hematemesis per report, I feel that after prolonged vomiting it is likely from a Mallory-Weiss tear. She appears well here, no chest pain, abdominal pain, or crepitus. Very low suspicion for more serious injury such as Boerhaave syndrome. No vomiting while in the ED. Will treat with Zofran as well as protonix and expected management.    Pricilla LovelessScott Ivis Nicolson, MD 12/08/14 1004

## 2015-05-24 ENCOUNTER — Emergency Department (HOSPITAL_BASED_OUTPATIENT_CLINIC_OR_DEPARTMENT_OTHER)
Admission: EM | Admit: 2015-05-24 | Discharge: 2015-05-24 | Disposition: A | Discharge status: Home / Self Care | Payer: Federal, State, Local not specified - PPO | Attending: Emergency Medicine | Admitting: Emergency Medicine

## 2015-05-24 ENCOUNTER — Encounter (HOSPITAL_BASED_OUTPATIENT_CLINIC_OR_DEPARTMENT_OTHER): Payer: Self-pay | Admitting: Emergency Medicine

## 2015-05-24 DIAGNOSIS — H748X3 Other specified disorders of middle ear and mastoid, bilateral: Secondary | ICD-10-CM | POA: Insufficient documentation

## 2015-05-24 DIAGNOSIS — J069 Acute upper respiratory infection, unspecified: Secondary | ICD-10-CM

## 2015-05-24 DIAGNOSIS — R197 Diarrhea, unspecified: Secondary | ICD-10-CM | POA: Insufficient documentation

## 2015-05-24 DIAGNOSIS — M1990 Unspecified osteoarthritis, unspecified site: Secondary | ICD-10-CM | POA: Insufficient documentation

## 2015-05-24 DIAGNOSIS — Z87891 Personal history of nicotine dependence: Secondary | ICD-10-CM | POA: Insufficient documentation

## 2015-05-24 LAB — RAPID STREP SCREEN (MED CTR MEBANE ONLY): STREPTOCOCCUS, GROUP A SCREEN (DIRECT): NEGATIVE

## 2015-05-24 MED ORDER — FLUTICASONE PROPIONATE 50 MCG/ACT NA SUSP: 2.0000 | Freq: Every day | NASAL | Status: DC

## 2015-05-24 MED ORDER — ACETAMINOPHEN 325 MG PO TABS: 650.0000 mg | ORAL_TABLET | Freq: Once | ORAL | Status: DC

## 2015-05-24 MED ORDER — CETIRIZINE HCL 10 MG PO TABS: 10.0000 mg | ORAL_TABLET | Freq: Every day | ORAL | Status: DC

## 2015-05-24 MED ORDER — NAPROXEN 250 MG PO TABS: 250.0000 mg | ORAL_TABLET | Freq: Two times a day (BID) | ORAL | Status: DC

## 2015-05-24 NOTE — ED Notes (Signed)
Sore throat with fever and diarrhea for one week.  Some N/V since Saturday.  pepto bismol has helped the diarrhea since Wednesday.

## 2015-05-24 NOTE — Discharge Instructions (Signed)
Upper Respiratory Infection, Adult °An upper respiratory infection (URI) is also sometimes known as the common cold. The upper respiratory tract includes the nose, sinuses, throat, trachea, and bronchi. Bronchi are the airways leading to the lungs. Most people improve within 1 week, but symptoms can last up to 2 weeks. A residual cough may last even longer.  °CAUSES °Many different viruses can infect the tissues lining the upper respiratory tract. The tissues become irritated and inflamed and often become very moist. Mucus production is also common. A cold is contagious. You can easily spread the virus to others by oral contact. This includes kissing, sharing a glass, coughing, or sneezing. Touching your mouth or nose and then touching a surface, which is then touched by another person, can also spread the virus. °SYMPTOMS  °Symptoms typically develop 1 to 3 days after you come in contact with a cold virus. Symptoms vary from person to person. They may include: °· Runny nose. °· Sneezing. °· Nasal congestion. °· Sinus irritation. °· Sore throat. °· Loss of voice (laryngitis). °· Cough. °· Fatigue. °· Muscle aches. °· Loss of appetite. °· Headache. °· Low-grade fever. °DIAGNOSIS  °You might diagnose your own cold based on familiar symptoms, since most people get a cold 2 to 3 times a year. Your caregiver can confirm this based on your exam. Most importantly, your caregiver can check that your symptoms are not due to another disease such as strep throat, sinusitis, pneumonia, asthma, or epiglottitis. Blood tests, throat tests, and X-rays are not necessary to diagnose a common cold, but they may sometimes be helpful in excluding other more serious diseases. Your caregiver will decide if any further tests are required. °RISKS AND COMPLICATIONS  °You may be at risk for a more severe case of the common cold if you smoke cigarettes, have chronic heart disease (such as heart failure) or lung disease (such as asthma), or if  you have a weakened immune system. The very young and very old are also at risk for more serious infections. Bacterial sinusitis, middle ear infections, and bacterial pneumonia can complicate the common cold. The common cold can worsen asthma and chronic obstructive pulmonary disease (COPD). Sometimes, these complications can require emergency medical care and may be life-threatening. °PREVENTION  °The best way to protect against getting a cold is to practice good hygiene. Avoid oral or hand contact with people with cold symptoms. Wash your hands often if contact occurs. There is no clear evidence that vitamin C, vitamin E, echinacea, or exercise reduces the chance of developing a cold. However, it is always recommended to get plenty of rest and practice good nutrition. °TREATMENT  °Treatment is directed at relieving symptoms. There is no cure. Antibiotics are not effective, because the infection is caused by a virus, not by bacteria. Treatment may include: °· Increased fluid intake. Sports drinks offer valuable electrolytes, sugars, and fluids. °· Breathing heated mist or steam (vaporizer or shower). °· Eating chicken soup or other clear broths, and maintaining good nutrition. °· Getting plenty of rest. °· Using gargles or lozenges for comfort. °· Controlling fevers with ibuprofen or acetaminophen as directed by your caregiver. °· Increasing usage of your inhaler if you have asthma. °Zinc gel and zinc lozenges, taken in the first 24 hours of the common cold, can shorten the duration and lessen the severity of symptoms. Pain medicines may help with fever, muscle aches, and throat pain. A variety of non-prescription medicines are available to treat congestion and runny nose. Your caregiver   can make recommendations and may suggest nasal or lung inhalers for other symptoms.  °HOME CARE INSTRUCTIONS  °· Only take over-the-counter or prescription medicines for pain, discomfort, or fever as directed by your  caregiver. °· Use a warm mist humidifier or inhale steam from a shower to increase air moisture. This may keep secretions moist and make it easier to breathe. °· Drink enough water and fluids to keep your urine clear or pale yellow. °· Rest as needed. °· Return to work when your temperature has returned to normal or as your caregiver advises. You may need to stay home longer to avoid infecting others. You can also use a face mask and careful hand washing to prevent spread of the virus. °SEEK MEDICAL CARE IF:  °· After the first few days, you feel you are getting worse rather than better. °· You need your caregiver's advice about medicines to control symptoms. °· You develop chills, worsening shortness of breath, or brown or red sputum. These may be signs of pneumonia. °· You develop yellow or brown nasal discharge or pain in the face, especially when you bend forward. These may be signs of sinusitis. °· You develop a fever, swollen neck glands, pain with swallowing, or white areas in the back of your throat. These may be signs of strep throat. °SEEK IMMEDIATE MEDICAL CARE IF:  °· You have a fever. °· You develop severe or persistent headache, ear pain, sinus pain, or chest pain. °· You develop wheezing, a prolonged cough, cough up blood, or have a change in your usual mucus (if you have chronic lung disease). °· You develop sore muscles or a stiff neck. °Document Released: 02/07/2001 Document Revised: 11/06/2011 Document Reviewed: 11/19/2013 °[employer]® Patient Information ©2015 [employer]. This information is not intended to replace advice given to you by your health care provider. Make sure you discuss any questions you have with your health care provider. °Nausea and Vomiting °Nausea is a sick feeling that often comes before throwing up (vomiting). Vomiting is a reflex where stomach contents come out of your mouth. Vomiting can cause severe loss of body fluids (dehydration). Children and elderly adults can  become dehydrated quickly, especially if they also have diarrhea. Nausea and vomiting are symptoms of a condition or disease. It is important to find the cause of your symptoms. °CAUSES  °· Direct irritation of the stomach lining. This irritation can result from increased acid production (gastroesophageal reflux disease), infection, food poisoning, taking certain medicines (such as nonsteroidal anti-inflammatory drugs), alcohol use, or tobacco use. °· Signals from the brain. These signals could be caused by a headache, heat exposure, an inner ear disturbance, increased pressure in the brain from injury, infection, a tumor, or a concussion, pain, emotional stimulus, or metabolic problems. °· An obstruction in the gastrointestinal tract (bowel obstruction). °· Illnesses such as diabetes, hepatitis, gallbladder problems, appendicitis, kidney problems, cancer, sepsis, atypical symptoms of a heart attack, or eating disorders. °· Medical treatments such as chemotherapy and radiation. °· Receiving medicine that makes you sleep (general anesthetic) during surgery. °DIAGNOSIS °Your caregiver may ask for tests to be done if the problems do not improve after a few days. Tests may also be done if symptoms are severe or if the reason for the nausea and vomiting is not clear. Tests may include: °· Urine tests. °· Blood tests. °· Stool tests. °· Cultures (to look for evidence of infection). °· X-rays or other imaging studies. °Test results can help your caregiver make decisions about treatment or   the need for additional tests. °TREATMENT °You need to stay well hydrated. Drink frequently but in small amounts. You may wish to drink water, sports drinks, clear broth, or eat frozen ice pops or gelatin dessert to help stay hydrated. When you eat, eating slowly may help prevent nausea. There are also some antinausea medicines that may help prevent nausea. °HOME CARE INSTRUCTIONS  °· Take all medicine as directed by your caregiver. °· If  you do not have an appetite, do not force yourself to eat. However, you must continue to drink fluids. °· If you have an appetite, eat a normal diet unless your caregiver tells you differently. °¨ Eat a variety of complex carbohydrates (rice, wheat, potatoes, bread), lean meats, yogurt, fruits, and vegetables. °¨ Avoid high-fat foods because they are more difficult to digest. °· Drink enough water and fluids to keep your urine clear or pale yellow. °· If you are dehydrated, ask your caregiver for specific rehydration instructions. Signs of dehydration may include: °¨ Severe thirst. °¨ Dry lips and mouth. °¨ Dizziness. °¨ Dark urine. °¨ Decreasing urine frequency and amount. °¨ Confusion. °¨ Rapid breathing or pulse. °SEEK IMMEDIATE MEDICAL CARE IF:  °· You have blood or brown flecks (like coffee grounds) in your vomit. °· You have black or bloody stools. °· You have a severe headache or stiff neck. °· You are confused. °· You have severe abdominal pain. °· You have chest pain or trouble breathing. °· You do not urinate at least once every 8 hours. °· You develop cold or clammy skin. °· You continue to vomit for longer than 24 to 48 hours. °· You have a fever. °MAKE SURE YOU:  °· Understand these instructions. °· Will watch your condition. °· Will get help right away if you are not doing well or get worse. °Document Released: 08/14/2005 Document Revised: 11/06/2011 Document Reviewed: 01/11/2011 °[employer]® Patient Information ©2015 [employer]. This information is not intended to replace advice given to you by your health care provider. Make sure you discuss any questions you have with your health care provider. ° °

## 2015-05-24 NOTE — ED Provider Notes (Signed)
CSN: [id number]     Arrival date & time 05/24/15  1008 History   First MD Initiated Contact with Patient 05/24/15 1014     Chief Complaint  Patient presents with  . Sore Throat  . Diarrhea   Julia Price is a 24 y.o. female who presents to the ED complaining of  Sneezing, nasal congestion, postnasal drip, runny nose, slight cough, body aches and burning throat for the past week. The patient also reports last week she had diarrhea. She reports the last time she had diarrhea was 5 days ago. Patient also reports some intermittent nausea and vomiting and last had nausea and vomiting yesterday. She denies any nausea, vomiting or diarrhea today.  She denies ever having abdominal pain.  She reports having subjective fever last week. She has taken nothing for treatment today. She reports she has been eating and drinking normally. The patient denies shortness of breath, wheezing, chest pain, palpitations, hemoptysis, hematochezia, hematuria, urinary symptoms, abdominal pain, or rashes.   (Consider location/radiation/quality/duration/timing/severity/associated sxs/prior Treatment) HPI  Past Medical History  Diagnosis Date  . Arthritis    No past surgical history on file. No family history on file. Social History  Substance Use Topics  . Smoking status: Former Smoker -- 1.00 packs/day    Types: Cigarettes    Quit date: 08/28/2014  . Smokeless tobacco: None  . Alcohol Use: Yes   OB History    No data available     Review of Systems  Constitutional: Positive for fatigue. Negative for fever and appetite change.  HENT: Positive for congestion, postnasal drip, rhinorrhea, sinus pressure, sneezing and sore throat. Negative for ear discharge, ear pain, facial swelling, mouth sores, nosebleeds and trouble swallowing.   Eyes: Negative for visual disturbance.  Respiratory: Positive for cough. Negative for chest tightness, shortness of breath and wheezing.   Cardiovascular: Negative for chest pain,  palpitations and leg swelling.  Gastrointestinal: Positive for nausea (resolved. ), vomiting (yesterday. resolved. ) and diarrhea (last week. ). Negative for abdominal pain.  Genitourinary: Negative for dysuria, urgency, frequency, hematuria and difficulty urinating.  Musculoskeletal: Positive for myalgias. Negative for back pain, neck pain and neck stiffness.  Skin: Negative for color change and rash.  Neurological: Negative for syncope, light-headedness and headaches.      Allergies  Review of patient's allergies indicates no known allergies.  Home Medications   Prior to Admission medications   Medication Sig Start Date End Date Taking? Authorizing Provider  bismuth subsalicylate (PEPTO BISMOL) 262 MG chewable tablet Chew 524 mg by mouth as needed.   Yes Historical Provider, MD  ibuprofen (ADVIL,MOTRIN) 200 MG tablet Take 800-1,200 mg by mouth every 8 (eight) hours as needed. For pain.    Yes Historical Provider, MD  Pseudoeph-Doxylamine-DM-APAP (NYQUIL PO) Take by mouth.   Yes Historical Provider, MD  cetirizine (ZYRTEC ALLERGY) 10 MG tablet Take 1 tablet (10 mg total) by mouth daily. 05/24/15   Everlene Farrier, PA-C  fluticasone (FLONASE) 50 MCG/ACT nasal spray Place 2 sprays into both nostrils daily. 05/24/15   Everlene Farrier, PA-C  naproxen (NAPROSYN) 250 MG tablet Take 1 tablet (250 mg total) by mouth 2 (two) times daily with a meal. 05/24/15   Everlene Farrier, PA-C   BP 111/71 mmHg  Pulse 94  Temp(Src) 99.4 F (37.4 C) (Oral)  Resp 16  Ht  (1.6 m)  SpO2 100%  LMP 05/10/2015 Physical Exam  Constitutional: She is oriented to person, place, and time. She appears well-developed and well-nourished. No  distress.   Nontoxic appearing.  HENT:  Head: Normocephalic and atraumatic.  Right Ear: External ear normal.  Left Ear: External ear normal.  Mouth/Throat: Oropharynx is clear and moist. No oropharyngeal exudate.  Bilateral tympanic membranes are pearly-gray without erythema  or loss of landmarks.  Mild bilateral middle ear effusion.  Boggy nasal turbinates bilaterally. Mild posterior oropharyngeal erythema without edema. No tonsillar hypertrophy or exudates.  no maxillary or frontal sinus tenderness to palpation.  Eyes: Conjunctivae are normal. Pupils are equal, round, and reactive to light. Right eye exhibits no discharge. Left eye exhibits no discharge.  Neck: Normal range of motion. Neck supple.  Cardiovascular: Normal rate, regular rhythm, normal heart sounds and intact distal pulses.  Exam reveals no gallop and no friction rub.   No murmur heard. Pulmonary/Chest: Effort normal and breath sounds normal. No respiratory distress. She has no wheezes. She has no rales.   Lungs are clear to auscultation bilaterally.  Abdominal: Soft. Bowel sounds are normal. She exhibits no distension. There is no tenderness. There is no guarding.   Abdomen is soft and nontender to palpation.  Musculoskeletal: She exhibits no edema or tenderness.   No lower extremity edema or tenderness.  Lymphadenopathy:    She has no cervical adenopathy.  Neurological: She is alert and oriented to person, place, and time. Coordination normal.  Skin: Skin is warm and dry. No rash noted. She is not diaphoretic. No erythema. No pallor.  Psychiatric: She has a normal mood and affect. Her behavior is normal.  Nursing note and vitals reviewed.   ED Course  Procedures (including critical care time) Labs Review Labs Reviewed  RAPID STREP SCREEN (NOT AT [facility])  CULTURE, GROUP A STREP    Imaging Review No results found.    EKG Interpretation None     Filed Vitals:   05/24/15 1013  BP: 111/71  Pulse: 94  Temp: 99.4 F (37.4 C)  TempSrc: Oral  Resp: 16  Height:  (1.6 m)  SpO2: 100%    MDM   Meds given in ED:  Medications  acetaminophen (TYLENOL) tablet 650 mg (not administered)    New Prescriptions   CETIRIZINE (ZYRTEC ALLERGY) 10 MG TABLET    Take 1 tablet (10 mg  total) by mouth daily.   FLUTICASONE (FLONASE) 50 MCG/ACT NASAL SPRAY    Place 2 sprays into both nostrils daily.   NAPROXEN (NAPROSYN) 250 MG TABLET    Take 1 tablet (250 mg total) by mouth 2 (two) times daily with a meal.    Final diagnoses:  URI (upper respiratory infection)   This  is a 24 y.o. female who presents to the ED complaining of  Sneezing, nasal congestion, postnasal drip, runny nose, slight cough, body aches and burning throat for the past week. The patient also reports last week she had diarrhea. She reports the last time she had diarrhea was 5 days ago. Patient also reports some intermittent nausea and vomiting and last had nausea and vomiting yesterday. She denies any nausea, vomiting or diarrhea today.  She denies ever having abdominal pain. On exam the patient is afebrile and nontoxic appearing. She is not tachypneic, tachycardic or hypoxic.  Patient's lungs are clear to auscultation bilaterally. She has no tonsillar hypertrophy or exudates. Her abdomen is soft and nontender to palpation. Rapid strep is negative.  Patient has viral illness. Will  Discharged with prescriptions for Zyrtec, fluticasone nasal spray and naproxen. I encouraged follow-up with primary care. I advised the patient to  follow-up with their primary care provider this week. I advised the patient to return to the emergency department with new or worsening symptoms or new concerns. The patient verbalized understanding and agreement with plan.      Everlene Farrier, PA-C 05/24/15 1120  Lyndal Pulley, MD 05/24/15 2052

## 2015-05-26 LAB — CULTURE, GROUP A STREP: Strep A Culture: NEGATIVE

## 2015-11-03 ENCOUNTER — Encounter (HOSPITAL_BASED_OUTPATIENT_CLINIC_OR_DEPARTMENT_OTHER): Payer: Self-pay | Admitting: *Deleted

## 2015-11-03 ENCOUNTER — Emergency Department (HOSPITAL_BASED_OUTPATIENT_CLINIC_OR_DEPARTMENT_OTHER): Payer: Self-pay

## 2015-11-03 ENCOUNTER — Emergency Department (HOSPITAL_BASED_OUTPATIENT_CLINIC_OR_DEPARTMENT_OTHER)
Admission: EM | Admit: 2015-11-03 | Discharge: 2015-11-03 | Disposition: A | Discharge status: Home / Self Care | Payer: Self-pay | Attending: Emergency Medicine | Admitting: Emergency Medicine

## 2015-11-03 DIAGNOSIS — R6883 Chills (without fever): Secondary | ICD-10-CM | POA: Insufficient documentation

## 2015-11-03 DIAGNOSIS — R112 Nausea with vomiting, unspecified: Secondary | ICD-10-CM | POA: Insufficient documentation

## 2015-11-03 DIAGNOSIS — R5383 Other fatigue: Secondary | ICD-10-CM | POA: Insufficient documentation

## 2015-11-03 DIAGNOSIS — R079 Chest pain, unspecified: Secondary | ICD-10-CM | POA: Insufficient documentation

## 2015-11-03 DIAGNOSIS — M791 Myalgia, unspecified site: Secondary | ICD-10-CM

## 2015-11-03 DIAGNOSIS — Z87891 Personal history of nicotine dependence: Secondary | ICD-10-CM | POA: Insufficient documentation

## 2015-11-03 DIAGNOSIS — K5900 Constipation, unspecified: Secondary | ICD-10-CM | POA: Insufficient documentation

## 2015-11-03 DIAGNOSIS — Z3202 Encounter for pregnancy test, result negative: Secondary | ICD-10-CM | POA: Insufficient documentation

## 2015-11-03 DIAGNOSIS — R1084 Generalized abdominal pain: Secondary | ICD-10-CM | POA: Insufficient documentation

## 2015-11-03 DIAGNOSIS — Z791 Long term (current) use of non-steroidal anti-inflammatories (NSAID): Secondary | ICD-10-CM | POA: Insufficient documentation

## 2015-11-03 DIAGNOSIS — Z8751 Personal history of pre-term labor: Secondary | ICD-10-CM | POA: Insufficient documentation

## 2015-11-03 DIAGNOSIS — R21 Rash and other nonspecific skin eruption: Secondary | ICD-10-CM | POA: Insufficient documentation

## 2015-11-03 DIAGNOSIS — Z79899 Other long term (current) drug therapy: Secondary | ICD-10-CM | POA: Insufficient documentation

## 2015-11-03 LAB — COMPREHENSIVE METABOLIC PANEL
ALBUMIN: 3.8 g/dL (ref 3.5–5.0)
ALT: 18 U/L (ref 14–54)
ANION GAP: 10 (ref 5–15)
AST: 21 U/L (ref 15–41)
Alkaline Phosphatase: 41 U/L (ref 38–126)
BUN: 9 mg/dL (ref 6–20)
CO2: 21 mmol/L — ABNORMAL LOW (ref 22–32)
Calcium: 8.6 mg/dL — ABNORMAL LOW (ref 8.9–10.3)
Chloride: 105 mmol/L (ref 101–111)
Creatinine, Ser: 0.62 mg/dL (ref 0.44–1.00)
GFR calc Af Amer: >60 mL/min (ref >60)
GFR calc non Af Amer: >60 mL/min (ref >60)
GLUCOSE: 104 mg/dL — AB (ref 65–99)
POTASSIUM: 3.5 mmol/L (ref 3.5–5.1)
SODIUM: 136 mmol/L (ref 135–145)
TOTAL PROTEIN: 6.6 g/dL (ref 6.5–8.1)
Total Bilirubin: 0.9 mg/dL (ref 0.3–1.2)

## 2015-11-03 LAB — CBC WITH DIFFERENTIAL/PLATELET
BASOS PCT: 1 %
Basophils Absolute: 0 10*3/uL (ref 0.0–0.1)
EOS ABS: 0.1 10*3/uL (ref 0.0–0.7)
EOS PCT: 1 %
HCT: 36.5 % (ref 36.0–46.0)
Hemoglobin: 12.5 g/dL (ref 12.0–15.0)
Lymphocytes Relative: 29 %
Lymphs Abs: 1.2 10*3/uL (ref 0.7–4.0)
MCH: 28 pg (ref 26.0–34.0)
MCHC: 34.2 g/dL (ref 30.0–36.0)
MCV: 81.7 fL (ref 78.0–100.0)
MONO ABS: 0.8 10*3/uL (ref 0.1–1.0)
MONOS PCT: 20 %
Neutro Abs: 2.1 10*3/uL (ref 1.7–7.7)
Neutrophils Relative %: 49 %
Platelets: 239 10*3/uL (ref 150–400)
RBC: 4.47 MIL/uL (ref 3.87–5.11)
RDW: 13.3 % (ref 11.5–15.5)
WBC: 4.2 10*3/uL (ref 4.0–10.5)

## 2015-11-03 LAB — URINALYSIS, ROUTINE W REFLEX MICROSCOPIC
Bilirubin Urine: NEGATIVE
GLUCOSE, UA: NEGATIVE mg/dL
Hgb urine dipstick: NEGATIVE
Ketones, ur: 40 mg/dL — AB
LEUKOCYTES UA: NEGATIVE
NITRITE: NEGATIVE
PH: 6 (ref 5.0–8.0)
PROTEIN: NEGATIVE mg/dL
Specific Gravity, Urine: 1.016 (ref 1.005–1.030)

## 2015-11-03 LAB — LIPASE, BLOOD: LIPASE: 21 U/L (ref 11–51)

## 2015-11-03 LAB — PREGNANCY, URINE: Preg Test, Ur: NEGATIVE

## 2015-11-03 MED ORDER — GI COCKTAIL ~~LOC~~
30.0000 mL | Freq: Once | ORAL | Status: AC
  Administered 2015-11-03: 30 mL via ORAL
  Filled 2015-11-03: qty 30

## 2015-11-03 MED ORDER — ONDANSETRON HCL 4 MG/2ML IJ SOLN
4.0000 mg | Freq: Once | INTRAMUSCULAR | Status: AC
  Administered 2015-11-03: 4 mg via INTRAVENOUS
  Filled 2015-11-03: qty 2

## 2015-11-03 MED ORDER — SODIUM CHLORIDE 0.9 % IV BOLUS (SEPSIS)
2000.0000 mL | Freq: Once | INTRAVENOUS | Status: AC
Start: 2015-11-03 — End: 2015-11-03
  Administered 2015-11-03: 1000 mL via INTRAVENOUS

## 2015-11-03 MED ORDER — ACETAMINOPHEN 325 MG PO TABS
650.0000 mg | ORAL_TABLET | ORAL | Status: DC | PRN
  Administered 2015-11-03: 650 mg via ORAL
  Filled 2015-11-03: qty 2

## 2015-11-03 MED ORDER — DOCUSATE SODIUM 100 MG PO CAPS: 100.0000 mg | ORAL_CAPSULE | Freq: Two times a day (BID) | ORAL | Status: DC | PRN

## 2015-11-03 MED ORDER — ONDANSETRON HCL 4 MG PO TABS: 4.0000 mg | ORAL_TABLET | Freq: Three times a day (TID) | ORAL | Status: DC | PRN

## 2015-11-03 MED FILL — DOK 100 MG SOFTGEL: 100 | 50 days supply | Qty: 100 | Fill #0

## 2015-11-03 MED FILL — ONDANSETRON HCL 4 MG TABLET: 4 | 4 days supply | Qty: 12 | Fill #0

## 2015-11-03 NOTE — ED Notes (Signed)
MD at bedside. 

## 2015-11-03 NOTE — ED Provider Notes (Signed)
CSN: [id number]     Arrival date & time 11/03/15  0746 History   First MD Initiated Contact with Patient 11/03/15 0802     Chief Complaint  Patient presents with  . Emesis     (Consider location/radiation/quality/duration/timing/severity/associated sxs/prior Treatment) HPI Patient presents with multiple episodes of vomiting starting 2 days ago. She states that she vomited continuously yesterday. She has associated chills and diffuse body aches. Denies any blood in vomit. Patient has not had a bowel movement since Sunday. States she is taking Pepto-Bismol with little relief. Endorses some pain in the chest with swallowing and palpation. Describes some vague abdominal pain without focality. Denies any urinary or vaginal symptoms. Multiple sick contacts with GI symptoms. States her son recently had vomiting illness that resolved spontaneously. Past Medical History  Diagnosis Date  . Arthritis    History reviewed. No pertinent past surgical history. History reviewed. No pertinent family history. Social History  Substance Use Topics  . Smoking status: Former Smoker -- 1.00 packs/day    Types: Cigarettes    Quit date: 08/28/2014  . Smokeless tobacco: None  . Alcohol Use: Yes   OB History    No data available     Review of Systems  Constitutional: Positive for chills and fatigue. Negative for fever.  HENT: Negative for congestion, sinus pressure and sore throat.   Respiratory: Negative for cough and shortness of breath.   Cardiovascular: Positive for chest pain. Negative for palpitations and leg swelling.  Gastrointestinal: Positive for nausea, vomiting, abdominal pain and constipation.  Genitourinary: Negative for dysuria, frequency, hematuria, flank pain, vaginal bleeding, vaginal discharge and pelvic pain.  Musculoskeletal: Positive for myalgias. Negative for neck pain and neck stiffness.  Skin: Negative for rash and wound.  Neurological: Positive for headaches. Negative for  dizziness, weakness, light-headedness and numbness.  All other systems reviewed and are negative.     Allergies  Review of patient's allergies indicates no known allergies.  Home Medications   Prior to Admission medications   Medication Sig Start Date End Date Taking? Authorizing Provider  bismuth subsalicylate (PEPTO BISMOL) 262 MG chewable tablet Chew 524 mg by mouth as needed.    Historical Provider, MD  cetirizine (ZYRTEC ALLERGY) 10 MG tablet Take 1 tablet (10 mg total) by mouth daily. 05/24/15   Everlene Farrier, PA-C  docusate sodium (COLACE) 100 MG capsule Take 1 capsule (100 mg total) by mouth 2 (two) times daily as needed for mild constipation. 11/03/15   Loren Racer, MD  fluticasone (FLONASE) 50 MCG/ACT nasal spray Place 2 sprays into both nostrils daily. 05/24/15   Everlene Farrier, PA-C  ibuprofen (ADVIL,MOTRIN) 200 MG tablet Take 800-1,200 mg by mouth every 8 (eight) hours as needed. For pain.     Historical Provider, MD  naproxen (NAPROSYN) 250 MG tablet Take 1 tablet (250 mg total) by mouth 2 (two) times daily with a meal. 05/24/15   Everlene Farrier, PA-C  ondansetron (ZOFRAN) 4 MG tablet Take 1 tablet (4 mg total) by mouth every 8 (eight) hours as needed for nausea or vomiting. 11/03/15   Loren Racer, MD  Pseudoeph-Doxylamine-DM-APAP (NYQUIL PO) Take by mouth.    Historical Provider, MD   BP 106/68 mmHg  Pulse 64  Resp 16  SpO2 97%  LMP 10/11/2015 Physical Exam  Constitutional: She is oriented to person, place, and time. She appears well-developed and well-nourished. No distress.  HENT:  Head: Normocephalic and atraumatic.  Mouth/Throat: Oropharynx is clear and moist. No oropharyngeal exudate.  Eyes: EOM  are normal. Pupils are equal, round, and reactive to light. Right eye exhibits no discharge. Left eye exhibits no discharge.  Neck: Normal range of motion. Neck supple.  No meningismus  Cardiovascular: Normal rate and regular rhythm.  Exam reveals no gallop and no  friction rub.   No murmur heard. Pulmonary/Chest: Effort normal and breath sounds normal. No respiratory distress. She has no wheezes. She has no rales. She exhibits tenderness (patient has tenderness to palpation across the sternal region of the chest. There is no crepitance or deformity.).  Abdominal: Soft. Bowel sounds are normal. She exhibits no distension and no mass. There is tenderness. There is no rebound and no guarding.  Mild diffuse abdominal tenderness to palpation without focality, rebound or guarding.  Musculoskeletal: Normal range of motion. She exhibits no edema or tenderness.  No definite CVA tenderness. Patient does have diffuse thoracic and lumbar muscular tenderness. No lower extremity swelling or asymmetry. Distal pulses are intact  Lymphadenopathy:    She has no cervical adenopathy.  Neurological: She is alert and oriented to person, place, and time.  5/5 motor in all extremities. Sensation is fully intact.  Skin: Skin is warm and dry. No rash noted. No erythema.  Psychiatric: She has a normal mood and affect. Her behavior is normal.  Nursing note and vitals reviewed.   ED Course  Procedures (including critical care time) Labs Review Labs Reviewed  COMPREHENSIVE METABOLIC PANEL - Abnormal; Notable for the following:    CO2 21 (*)    Glucose, Bld 104 (*)    Calcium 8.6 (*)    All other components within normal limits  URINALYSIS, ROUTINE W REFLEX MICROSCOPIC (NOT AT [facility]) - Abnormal; Notable for the following:    Ketones, ur 40 (*)    All other components within normal limits  CBC WITH DIFFERENTIAL/PLATELET  LIPASE, BLOOD  PREGNANCY, URINE    Imaging Review Dg Abd Acute W/chest  11/03/2015  CLINICAL DATA:  Nausea, vomiting, weakness and dizziness for 2 days. EXAM: DG ABDOMEN ACUTE W/ 1V CHEST COMPARISON:  Chest radiographs 08/01/2014. FINDINGS: The heart size and mediastinal contours are normal. The lungs are clear. There is no pleural effusion or pneumothorax.  No acute osseous findings are identified. The bowel gas pattern is normal. There is high density particulate matter throughout the colon. There is no free intraperitoneal air or suspicious abdominal calcification. The bones appear unremarkable. IMPRESSION: 1. No acute cardiopulmonary or abdominal process. 2. High density ingested particular particulate matter throughout the colon. Electronically Signed   By: Carey BullocksWilliam  Veazey M.D.   On: 11/03/2015 10:44   I have personally reviewed and evaluated these images and lab results as part of my medical decision-making.   EKG Interpretation None      MDM   Final diagnoses:  Non-intractable vomiting with nausea, vomiting of unspecified type  Constipation, unspecified constipation type  Myalgia    Patient presents with multiple episodes of vomiting and diffuse body aches. She also has some chest pain and abdominal pain. The chest pain is atypical and exacerbated by palpation and swallowing. Will get chest x-ray to rule out any evidence of pneumomediastinum. The abdominal exam is benign. Believe that CT imaging is not needed at this point. Likely GI virus. We'll treat symptomatically and give IV fluids as well as checking electrolytes.  Patient states she's felt much better after IV fluids. Abdominal exam remains benign. Tolerating fluids by mouth. Return precautions given.  Loren Raceravid Jarious Lyon, MD 11/04/15 1212

## 2015-11-03 NOTE — Discharge Instructions (Signed)

## 2015-11-03 NOTE — ED Notes (Signed)
Pt reports vomiting x last night, nausea x Monday. Pt states her coworkers have been ill with gi symptoms also. Denies any fevers, sore throat, cough or other c/o. Last bm Sunday.

## 2016-01-10 ENCOUNTER — Emergency Department (HOSPITAL_BASED_OUTPATIENT_CLINIC_OR_DEPARTMENT_OTHER)
Admission: EM | Admit: 2016-01-10 | Discharge: 2016-01-10 | Disposition: A | Discharge status: Home / Self Care | Payer: Federal, State, Local not specified - PPO | Attending: Emergency Medicine | Admitting: Emergency Medicine

## 2016-01-10 ENCOUNTER — Encounter (HOSPITAL_BASED_OUTPATIENT_CLINIC_OR_DEPARTMENT_OTHER): Payer: Self-pay | Admitting: *Deleted

## 2016-01-10 DIAGNOSIS — R197 Diarrhea, unspecified: Secondary | ICD-10-CM | POA: Insufficient documentation

## 2016-01-10 DIAGNOSIS — Z791 Long term (current) use of non-steroidal anti-inflammatories (NSAID): Secondary | ICD-10-CM | POA: Insufficient documentation

## 2016-01-10 DIAGNOSIS — Z79899 Other long term (current) drug therapy: Secondary | ICD-10-CM | POA: Insufficient documentation

## 2016-01-10 DIAGNOSIS — M1990 Unspecified osteoarthritis, unspecified site: Secondary | ICD-10-CM | POA: Insufficient documentation

## 2016-01-10 DIAGNOSIS — R112 Nausea with vomiting, unspecified: Secondary | ICD-10-CM | POA: Insufficient documentation

## 2016-01-10 DIAGNOSIS — Z87891 Personal history of nicotine dependence: Secondary | ICD-10-CM | POA: Insufficient documentation

## 2016-01-10 LAB — URINALYSIS, ROUTINE W REFLEX MICROSCOPIC
BILIRUBIN URINE: NEGATIVE
GLUCOSE, UA: NEGATIVE mg/dL
Hgb urine dipstick: NEGATIVE
KETONES UR: NEGATIVE mg/dL
LEUKOCYTES UA: NEGATIVE
NITRITE: NEGATIVE
PH: 8.5 — AB (ref 5.0–8.0)
PROTEIN: NEGATIVE mg/dL
Specific Gravity, Urine: 1.015 (ref 1.005–1.030)

## 2016-01-10 LAB — CBC WITH DIFFERENTIAL/PLATELET
BASOS ABS: 0 10*3/uL (ref 0.0–0.1)
Basophils Relative: 1 %
EOS ABS: 0 10*3/uL (ref 0.0–0.7)
EOS PCT: 1 %
HCT: 36 % (ref 36.0–46.0)
Hemoglobin: 12.4 g/dL (ref 12.0–15.0)
Lymphocytes Relative: 35 %
Lymphs Abs: 1.5 10*3/uL (ref 0.7–4.0)
MCH: 28.1 pg (ref 26.0–34.0)
MCHC: 34.4 g/dL (ref 30.0–36.0)
MCV: 81.4 fL (ref 78.0–100.0)
MONO ABS: 0.4 10*3/uL (ref 0.1–1.0)
Monocytes Relative: 9 %
Neutro Abs: 2.3 10*3/uL (ref 1.7–7.7)
Neutrophils Relative %: 54 %
PLATELETS: 286 10*3/uL (ref 150–400)
RBC: 4.42 MIL/uL (ref 3.87–5.11)
RDW: 14 % (ref 11.5–15.5)
WBC: 4.3 10*3/uL (ref 4.0–10.5)

## 2016-01-10 LAB — COMPREHENSIVE METABOLIC PANEL
ALK PHOS: 43 U/L (ref 38–126)
ALT: 16 U/L (ref 14–54)
AST: 19 U/L (ref 15–41)
Albumin: 3.9 g/dL (ref 3.5–5.0)
Anion gap: 8 (ref 5–15)
BILIRUBIN TOTAL: 0.5 mg/dL (ref 0.3–1.2)
BUN: 9 mg/dL (ref 6–20)
CALCIUM: 9.1 mg/dL (ref 8.9–10.3)
CO2: 24 mmol/L (ref 22–32)
Chloride: 105 mmol/L (ref 101–111)
Creatinine, Ser: 0.63 mg/dL (ref 0.44–1.00)
Glucose, Bld: 99 mg/dL (ref 65–99)
Potassium: 3.8 mmol/L (ref 3.5–5.1)
Sodium: 137 mmol/L (ref 135–145)
TOTAL PROTEIN: 6.8 g/dL (ref 6.5–8.1)

## 2016-01-10 LAB — PREGNANCY, URINE: PREG TEST UR: NEGATIVE

## 2016-01-10 LAB — LIPASE, BLOOD: LIPASE: 19 U/L (ref 11–51)

## 2016-01-10 MED ORDER — PROMETHAZINE HCL 25 MG PO TABS: 25.0000 mg | ORAL_TABLET | Freq: Four times a day (QID) | ORAL | Status: DC | PRN

## 2016-01-10 MED ORDER — DICYCLOMINE HCL 10 MG PO CAPS
10.0000 mg | ORAL_CAPSULE | Freq: Once | ORAL | Status: AC
  Administered 2016-01-10: 10 mg via ORAL
  Filled 2016-01-10: qty 1

## 2016-01-10 MED ORDER — ONDANSETRON HCL 4 MG/2ML IJ SOLN
4.0000 mg | Freq: Once | INTRAMUSCULAR | Status: AC
  Administered 2016-01-10: 4 mg via INTRAVENOUS
  Filled 2016-01-10: qty 2

## 2016-01-10 MED ORDER — SODIUM CHLORIDE 0.9 % IV BOLUS (SEPSIS)
1000.0000 mL | Freq: Once | INTRAVENOUS | Status: AC
  Administered 2016-01-10: 1000 mL via INTRAVENOUS

## 2016-01-10 MED FILL — PROMETHAZINE 25 MG TABLET: 25 | 2 days supply | Qty: 6 | Fill #0

## 2016-01-10 NOTE — ED Provider Notes (Signed)
CSN: [id number]     Arrival date & time 01/10/16  0744 History   First MD Initiated Contact with Patient 01/10/16 0759     No chief complaint on file.    (Consider location/radiation/quality/duration/timing/severity/associated sxs/prior Treatment) HPI Comments: 25 year old female who presents with vomiting and diarrhea. Last night, the patient began having nausea and vomiting associated with diarrhea. She has had upper abdominal cramping. She tried to take Tylenol and Pepto-Bismol but threw them up. She has had decreased appetite. No fevers or cough/cold symptoms. She denies any dysuria, hematuria, or vaginal discharge. No fevers. No sick contacts or recent travel. No new medications recently.  The history is provided by the patient.    Past Medical History  Diagnosis Date  . Arthritis    History reviewed. No pertinent past surgical history. No family history on file. Social History  Substance Use Topics  . Smoking status: Former Smoker -- 1.00 packs/day    Types: Cigarettes    Quit date: 08/28/2014  . Smokeless tobacco: None  . Alcohol Use: Yes   OB History    No data available     Review of Systems 10 Systems reviewed and are negative for acute change except as noted in the HPI.    Allergies  Review of patient's allergies indicates no known allergies.  Home Medications   Prior to Admission medications   Medication Sig Start Date End Date Taking? Authorizing Provider  bismuth subsalicylate (PEPTO BISMOL) 262 MG chewable tablet Chew 524 mg by mouth as needed.   Yes Historical Provider, MD  cetirizine (ZYRTEC ALLERGY) 10 MG tablet Take 1 tablet (10 mg total) by mouth daily. 05/24/15  Yes Everlene FarrierWilliam Dansie, PA-C  docusate sodium (COLACE) 100 MG capsule Take 1 capsule (100 mg total) by mouth 2 (two) times daily as needed for mild constipation. 11/03/15  Yes Loren Raceravid Yelverton, MD  fluticasone (FLONASE) 50 MCG/ACT nasal spray Place 2 sprays into both nostrils daily. 05/24/15  Yes  Everlene FarrierWilliam Dansie, PA-C  ibuprofen (ADVIL,MOTRIN) 200 MG tablet Take 800-1,200 mg by mouth every 8 (eight) hours as needed. For pain.    Yes Historical Provider, MD  Pseudoeph-Doxylamine-DM-APAP (NYQUIL PO) Take by mouth.   Yes Historical Provider, MD  promethazine (PHENERGAN) 25 MG tablet Take 1 tablet (25 mg total) by mouth every 6 (six) hours as needed for nausea or vomiting. 01/10/16   Laurence Spatesachel Morgan Idabelle Mcpeters, MD   BP 121/63 mmHg  Pulse 78  Temp(Src) 98.1 F (36.7 C) (Oral)  Resp 18  Ht 5\' 3"  (1.6 m)  Wt 140 lb (63.504 kg)  BMI 24.81 kg/m2  SpO2 100%  LMP 12/31/2015 Physical Exam  Constitutional: She is oriented to person, place, and time. She appears well-developed and well-nourished. No distress.  Laying on side, uncomfortable but non-toxic  HENT:  Head: Normocephalic and atraumatic.  dry mucous membranes  Eyes: Conjunctivae are normal.  Neck: Neck supple.  Cardiovascular: Normal rate, regular rhythm and normal heart sounds.   No murmur heard. Pulmonary/Chest: Effort normal and breath sounds normal.  Abdominal: Soft. Bowel sounds are normal. She exhibits no distension. There is no rebound and no guarding.  Tenderness across upper abdomen, worst in mid-epigastrium  Musculoskeletal: She exhibits no edema.  Neurological: She is alert and oriented to person, place, and time.  Fluent speech  Skin: Skin is warm and dry.  Psychiatric: She has a normal mood and affect. Judgment normal.  Nursing note and vitals reviewed.   ED Course  Procedures (including critical care time) Labs  Review Labs Reviewed  URINALYSIS, ROUTINE W REFLEX MICROSCOPIC (NOT AT [facility]) - Abnormal; Notable for the following:    pH 8.5 (*)    All other components within normal limits  COMPREHENSIVE METABOLIC PANEL  LIPASE, BLOOD  CBC WITH DIFFERENTIAL/PLATELET  PREGNANCY, URINE    Imaging Review No results found. I have personally reviewed and evaluated these lab results as part of my medical  decision-making.  Medications  sodium chloride 0.9 % bolus 1,000 mL (0 mLs Intravenous Stopped 01/10/16 0904)  ondansetron (ZOFRAN) injection 4 mg (4 mg Intravenous Given 01/10/16 0823)  dicyclomine (BENTYL) capsule 10 mg (10 mg Oral Given 01/10/16 0957)     MDM   Final diagnoses:  Nausea vomiting and diarrhea   Pt w/ sudden onset of vomiting and diarrhea last night w/ upper Abdominal cramping. She was nontoxic with normal vital signs on exam. Upper abdominal tenderness particularly in midepigastrium without lower abdominal tenderness noted. Obtained above labs, gave the patient Zofran and an IV fluid bolus.  Labwork unremarkable and shows no evidence of dehydration, normal WBC count. After receiving Zofran, patient was able to tolerate ginger ale in the ED with no episodes of vomiting. Given no lower abdominal tenderness and reassuring lab work, I feel she is safe for discharge. Discussed supportive care instructions and provided with Phenergan to use at home. Reviewed return precautions including fever, worsening abdominal pain, or intractable vomiting. Patient voiced understanding and was discharged in satisfactory condition.  Laurence Spates, MD 01/10/16 [phone]

## 2016-01-10 NOTE — ED Notes (Signed)
Pt drinking fluids at bedside

## 2016-01-10 NOTE — ED Notes (Signed)
C/o n/v/d since last pm. States hx of same. C/o upper abd cramps. No problems with urination. No vaginal discharge. No fever. Last NL BM was 2 days ago. Decreased appetite.

## 2016-01-10 NOTE — ED Notes (Signed)
Ginger ale given per pt request for po challenge.

## 2016-03-15 ENCOUNTER — Emergency Department (HOSPITAL_BASED_OUTPATIENT_CLINIC_OR_DEPARTMENT_OTHER)
Admission: EM | Admit: 2016-03-15 | Discharge: 2016-03-15 | Disposition: A | Discharge status: Home / Self Care | Payer: Self-pay | Attending: Emergency Medicine | Admitting: Emergency Medicine

## 2016-03-15 ENCOUNTER — Emergency Department (HOSPITAL_BASED_OUTPATIENT_CLINIC_OR_DEPARTMENT_OTHER): Payer: Self-pay

## 2016-03-15 ENCOUNTER — Encounter (HOSPITAL_BASED_OUTPATIENT_CLINIC_OR_DEPARTMENT_OTHER): Payer: Self-pay | Admitting: Emergency Medicine

## 2016-03-15 DIAGNOSIS — Z791 Long term (current) use of non-steroidal anti-inflammatories (NSAID): Secondary | ICD-10-CM | POA: Insufficient documentation

## 2016-03-15 DIAGNOSIS — R1115 Cyclical vomiting syndrome unrelated to migraine: Secondary | ICD-10-CM

## 2016-03-15 DIAGNOSIS — G43A1 Cyclical vomiting, intractable: Secondary | ICD-10-CM | POA: Insufficient documentation

## 2016-03-15 DIAGNOSIS — Z79899 Other long term (current) drug therapy: Secondary | ICD-10-CM | POA: Insufficient documentation

## 2016-03-15 DIAGNOSIS — R42 Dizziness and giddiness: Secondary | ICD-10-CM | POA: Insufficient documentation

## 2016-03-15 DIAGNOSIS — Z87891 Personal history of nicotine dependence: Secondary | ICD-10-CM | POA: Insufficient documentation

## 2016-03-15 DIAGNOSIS — R197 Diarrhea, unspecified: Secondary | ICD-10-CM | POA: Insufficient documentation

## 2016-03-15 LAB — COMPREHENSIVE METABOLIC PANEL
ALBUMIN: 4 g/dL (ref 3.5–5.0)
ALK PHOS: 46 U/L (ref 38–126)
ALT: 15 U/L (ref 14–54)
ANION GAP: 7 (ref 5–15)
AST: 21 U/L (ref 15–41)
BILIRUBIN TOTAL: 0.8 mg/dL (ref 0.3–1.2)
BUN: 11 mg/dL (ref 6–20)
CALCIUM: 9 mg/dL (ref 8.9–10.3)
CO2: 23 mmol/L (ref 22–32)
CREATININE: 0.72 mg/dL (ref 0.44–1.00)
Chloride: 107 mmol/L (ref 101–111)
GFR calc Af Amer: >60 mL/min (ref >60)
GFR calc non Af Amer: >60 mL/min (ref >60)
GLUCOSE: 102 mg/dL — AB (ref 65–99)
Potassium: 3.9 mmol/L (ref 3.5–5.1)
Sodium: 137 mmol/L (ref 135–145)
TOTAL PROTEIN: 7.1 g/dL (ref 6.5–8.1)

## 2016-03-15 LAB — CBC WITH DIFFERENTIAL/PLATELET
BASOS PCT: 0 %
Basophils Absolute: 0 10*3/uL (ref 0.0–0.1)
EOS ABS: 0 10*3/uL (ref 0.0–0.7)
EOS PCT: 1 %
HCT: 36.3 % (ref 36.0–46.0)
HEMOGLOBIN: 12.6 g/dL (ref 12.0–15.0)
LYMPHS ABS: 1.3 10*3/uL (ref 0.7–4.0)
Lymphocytes Relative: 25 %
MCH: 28.3 pg (ref 26.0–34.0)
MCHC: 34.7 g/dL (ref 30.0–36.0)
MCV: 81.6 fL (ref 78.0–100.0)
MONO ABS: 0.4 10*3/uL (ref 0.1–1.0)
MONOS PCT: 8 %
NEUTROS PCT: 66 %
Neutro Abs: 3.4 10*3/uL (ref 1.7–7.7)
PLATELETS: 278 10*3/uL (ref 150–400)
RBC: 4.45 MIL/uL (ref 3.87–5.11)
RDW: 13.4 % (ref 11.5–15.5)
WBC: 5.2 10*3/uL (ref 4.0–10.5)

## 2016-03-15 LAB — URINALYSIS, ROUTINE W REFLEX MICROSCOPIC
BILIRUBIN URINE: NEGATIVE
Glucose, UA: NEGATIVE mg/dL
HGB URINE DIPSTICK: NEGATIVE
KETONES UR: NEGATIVE mg/dL
Leukocytes, UA: NEGATIVE
NITRITE: NEGATIVE
Protein, ur: NEGATIVE mg/dL
SPECIFIC GRAVITY, URINE: 1.014 (ref 1.005–1.030)
pH: 7.5 (ref 5.0–8.0)

## 2016-03-15 LAB — RAPID URINE DRUG SCREEN, HOSP PERFORMED
AMPHETAMINES: NOT DETECTED
Barbiturates: NOT DETECTED
Benzodiazepines: NOT DETECTED
Cocaine: NOT DETECTED
OPIATES: NOT DETECTED
Tetrahydrocannabinol: POSITIVE — AB

## 2016-03-15 LAB — LIPASE, BLOOD: Lipase: 19 U/L (ref 11–51)

## 2016-03-15 LAB — PREGNANCY, URINE: PREG TEST UR: NEGATIVE

## 2016-03-15 MED ORDER — PROMETHAZINE HCL 25 MG/ML IJ SOLN: 12.5000 mg | Freq: Once | INTRAMUSCULAR | Status: DC

## 2016-03-15 MED ORDER — PROMETHAZINE HCL 25 MG PO TABS: 25.0000 mg | ORAL_TABLET | Freq: Four times a day (QID) | ORAL | Status: DC | PRN

## 2016-03-15 MED ORDER — SODIUM CHLORIDE 0.9 % IV BOLUS (SEPSIS)
1000.0000 mL | Freq: Once | INTRAVENOUS | Status: AC
  Administered 2016-03-15: 1000 mL via INTRAVENOUS

## 2016-03-15 MED ORDER — ONDANSETRON HCL 4 MG/2ML IJ SOLN
4.0000 mg | Freq: Once | INTRAMUSCULAR | Status: AC
  Administered 2016-03-15: 4 mg via INTRAVENOUS
  Filled 2016-03-15: qty 2

## 2016-03-15 MED ORDER — SODIUM CHLORIDE 0.9 % IV SOLN
INTRAVENOUS | Status: DC
  Administered 2016-03-15: 10:00:00 via INTRAVENOUS

## 2016-03-15 MED ORDER — IOPAMIDOL (ISOVUE-300) INJECTION 61%
100.0000 mL | Freq: Once | INTRAVENOUS | Status: AC | PRN
  Administered 2016-03-15: 100 mL via INTRAVENOUS

## 2016-03-15 MED FILL — PROMETHAZINE 25 MG TABLET: 25 | 3 days supply | Qty: 12 | Fill #0 | Status: TO

## 2016-03-15 NOTE — ED Notes (Signed)
Patient transported to CT 

## 2016-03-15 NOTE — ED Notes (Signed)
MD at bedside. 

## 2016-03-15 NOTE — Discharge Instructions (Signed)
Take the Phenergan along with you Zofran. Make appointment to follow-up with GI medicine. Consider the concerns raised about THC. Return for any new or worse symptoms. Today's workup without any significant findings. Work note provided.

## 2016-03-15 NOTE — ED Provider Notes (Signed)
CSN: [id number]     Arrival date & time 03/15/16  0745 History   First MD Initiated Contact with Patient 03/15/16 [phone]     Chief Complaint  Patient presents with  . Emesis     (Consider location/radiation/quality/duration/timing/severity/associated sxs/prior Treatment) Patient is a 25 y.o. female presenting with vomiting. The history is provided by the patient.  Emesis Associated symptoms: diarrhea   Associated symptoms: no abdominal pain, no headaches and no myalgias   Patient presents with a complaint of nausea vomiting some dizziness and mild diarrhea. Acute onset during the wee hours of the morning. No abdominal pain. Patient's had this happen several times before. Doesn't know why it occurs. Usually occurs in the early morning hours.  Past Medical History  Diagnosis Date  . Arthritis    History reviewed. No pertinent past surgical history. No family history on file. Social History  Substance Use Topics  . Smoking status: Former Smoker -- 1.00 packs/day    Types: Cigarettes    Quit date: 08/28/2014  . Smokeless tobacco: None  . Alcohol Use: Yes   OB History    No data available     Review of Systems  Constitutional: Negative for fever.  HENT: Negative for congestion.   Eyes: Negative for visual disturbance.  Respiratory: Negative for shortness of breath.   Cardiovascular: Negative for chest pain.  Gastrointestinal: Positive for nausea, vomiting and diarrhea. Negative for abdominal pain.  Genitourinary: Negative for dysuria.  Musculoskeletal: Negative for myalgias, back pain and neck pain.  Skin: Negative for rash.  Neurological: Positive for dizziness. Negative for headaches.  Hematological: Does not bruise/bleed easily.      Allergies  Review of patient's allergies indicates no known allergies.  Home Medications   Prior to Admission medications   Medication Sig Start Date End Date Taking? Authorizing Provider  ibuprofen (ADVIL,MOTRIN) 200 MG tablet Take  800-1,200 mg by mouth every 8 (eight) hours as needed. For pain.    Yes Historical Provider, MD  bismuth subsalicylate (PEPTO BISMOL) 262 MG chewable tablet Chew 524 mg by mouth as needed.    Historical Provider, MD  cetirizine (ZYRTEC ALLERGY) 10 MG tablet Take 1 tablet (10 mg total) by mouth daily. 05/24/15   Everlene FarrierWilliam Dansie, PA-C  docusate sodium (COLACE) 100 MG capsule Take 1 capsule (100 mg total) by mouth 2 (two) times daily as needed for mild constipation. 11/03/15   Loren Raceravid Yelverton, MD  fluticasone (FLONASE) 50 MCG/ACT nasal spray Place 2 sprays into both nostrils daily. 05/24/15   Everlene FarrierWilliam Dansie, PA-C  promethazine (PHENERGAN) 25 MG tablet Take 1 tablet (25 mg total) by mouth every 6 (six) hours as needed for nausea or vomiting. 01/10/16   Laurence Spatesachel Morgan Little, MD  promethazine (PHENERGAN) 25 MG tablet Take 1 tablet (25 mg total) by mouth every 6 (six) hours as needed for nausea or vomiting. 03/15/16   Vanetta MuldersScott Sriyan Cutting, MD  Pseudoeph-Doxylamine-DM-APAP (NYQUIL PO) Take by mouth.    Historical Provider, MD   BP 109/62 mmHg  Pulse 57  Temp(Src) 98.8 F (37.1 C) (Oral)  Resp 16  Ht 5\' 3"  (1.6 m)  Wt 63.504 kg  BMI 24.81 kg/m2  SpO2 100%  LMP 02/20/2016 Physical Exam  Constitutional: She is oriented to person, place, and time. She appears well-developed and well-nourished. No distress.  HENT:  Head: Normocephalic and atraumatic.  Mouth/Throat: Oropharynx is clear and moist.  Eyes: Conjunctivae and EOM are normal. Pupils are equal, round, and reactive to light.  Neck: Normal range of  motion. Neck supple.  Cardiovascular: Normal rate, regular rhythm and normal heart sounds.   No murmur heard. Pulmonary/Chest: Effort normal and breath sounds normal.  Abdominal: Soft. Bowel sounds are normal. There is no tenderness.  Musculoskeletal: Normal range of motion. She exhibits no edema.  Neurological: She is alert and oriented to person, place, and time. No cranial nerve deficit. She exhibits normal  muscle tone. Coordination normal.  Skin: Skin is warm. No rash noted.  Nursing note and vitals reviewed.   ED Course  Procedures (including critical care time) Labs Review Labs Reviewed  COMPREHENSIVE METABOLIC PANEL - Abnormal; Notable for the following:    Glucose, Bld 102 (*)    All other components within normal limits  URINE RAPID DRUG SCREEN, HOSP PERFORMED - Abnormal; Notable for the following:    Tetrahydrocannabinol POSITIVE (*)    All other components within normal limits  LIPASE, BLOOD  CBC WITH DIFFERENTIAL/PLATELET  URINALYSIS, ROUTINE W REFLEX MICROSCOPIC (NOT AT [facility])  PREGNANCY, URINE   Results for orders placed or performed during the hospital encounter of 03/15/16  Comprehensive metabolic panel  Result Value Ref Range   Sodium 137 135 - 145 mmol/L   Potassium 3.9 3.5 - 5.1 mmol/L   Chloride 107 101 - 111 mmol/L   CO2 23 22 - 32 mmol/L   Glucose, Bld 102 (H) 65 - 99 mg/dL   BUN 11 6 - 20 mg/dL   Creatinine, Ser 1.61 0.44 - 1.00 mg/dL   Calcium 9.0 8.9 - 09.6 mg/dL   Total Protein 7.1 6.5 - 8.1 g/dL   Albumin 4.0 3.5 - 5.0 g/dL   AST 21 15 - 41 U/L   ALT 15 14 - 54 U/L   Alkaline Phosphatase 46 38 - 126 U/L   Total Bilirubin 0.8 0.3 - 1.2 mg/dL   GFR calc non Af Amer >60 >60 mL/min   GFR calc Af Amer >60 >60 mL/min   Anion gap 7 5 - 15  Lipase, blood  Result Value Ref Range   Lipase 19 11 - 51 U/L  CBC with Differential/Platelet  Result Value Ref Range   WBC 5.2 4.0 - 10.5 K/uL   RBC 4.45 3.87 - 5.11 MIL/uL   Hemoglobin 12.6 12.0 - 15.0 g/dL   HCT 04.5 40.9 - 81.1 %   MCV 81.6 78.0 - 100.0 fL   MCH 28.3 26.0 - 34.0 pg   MCHC 34.7 30.0 - 36.0 g/dL   RDW 91.4 78.2 - 95.6 %   Platelets 278 150 - 400 K/uL   Neutrophils Relative % 66 %   Neutro Abs 3.4 1.7 - 7.7 K/uL   Lymphocytes Relative 25 %   Lymphs Abs 1.3 0.7 - 4.0 K/uL   Monocytes Relative 8 %   Monocytes Absolute 0.4 0.1 - 1.0 K/uL   Eosinophils Relative 1 %   Eosinophils Absolute 0.0  0.0 - 0.7 K/uL   Basophils Relative 0 %   Basophils Absolute 0.0 0.0 - 0.1 K/uL  Urinalysis, Routine w reflex microscopic (not at [facility])  Result Value Ref Range   Color, Urine YELLOW YELLOW   APPearance CLEAR CLEAR   Specific Gravity, Urine 1.014 1.005 - 1.030   pH 7.5 5.0 - 8.0   Glucose, UA NEGATIVE NEGATIVE mg/dL   Hgb urine dipstick NEGATIVE NEGATIVE   Bilirubin Urine NEGATIVE NEGATIVE   Ketones, ur NEGATIVE NEGATIVE mg/dL   Protein, ur NEGATIVE NEGATIVE mg/dL   Nitrite NEGATIVE NEGATIVE   Leukocytes, UA NEGATIVE NEGATIVE  Pregnancy, urine  Result Value Ref Range   Preg Test, Ur NEGATIVE NEGATIVE  Urine rapid drug screen (hosp performed)  Result Value Ref Range   Opiates NONE DETECTED NONE DETECTED   Cocaine NONE DETECTED NONE DETECTED   Benzodiazepines NONE DETECTED NONE DETECTED   Amphetamines NONE DETECTED NONE DETECTED   Tetrahydrocannabinol POSITIVE (A) NONE DETECTED   Barbiturates NONE DETECTED NONE DETECTED    Imaging Review Ct Abdomen Pelvis W Contrast  03/15/2016  CLINICAL DATA:  Nausea and vomiting. Dizziness. Diarrhea. Sickle-cell trait. EXAM: CT ABDOMEN AND PELVIS WITH CONTRAST TECHNIQUE: Multidetector CT imaging of the abdomen and pelvis was performed using the standard protocol following bolus administration of intravenous contrast. CONTRAST:  ISOVUE-300 IOPAMIDOL (ISOVUE-300) INJECTION 61% COMPARISON:  Plain films 11/03/2015. No prior CT. Pelvic ultrasound 10/02/2008 reviewed. FINDINGS: Lower chest: Clear lung bases. Normal heart size without pericardial or pleural effusion. Hepatobiliary: Normal liver. Normal gallbladder, without biliary ductal dilatation. Pancreas: Normal, without mass or ductal dilatation. Spleen: Normal in size, without focal abnormality. Adrenals/Urinary Tract: Normal adrenal glands. Normal kidneys, without hydronephrosis. Normal urinary bladder. Stomach/Bowel: Normal stomach, without wall thickening. Normal colon, appendix, and terminal  ileum. Normal small bowel. Vascular/Lymphatic: Normal caliber of the aorta and branch vessels. No abdominopelvic adenopathy. Reproductive: Retroverted uterus. A left ovarian corpus luteal cyst measures 1.7 cm on image 59/series 2. Normal right adnexa. Other: No significant free fluid. Musculoskeletal: No acute osseous abnormality. IMPRESSION: 1. No acute process or explanation for nausea, vomiting, or dizziness. 2. Left ovarian corpus luteal cyst. Electronically Signed   By: Jeronimo Greaves M.D.   On: 03/15/2016 10:40   I have personally reviewed and evaluated these images and lab results as part of my medical decision-making.   EKG Interpretation None      MDM   Final diagnoses:  Intractable cyclical vomiting with nausea    The patient now with several visits for nausea and vomiting. Suggestive of cyclical vomiting syndrome. Patient states she only uses THC rarely. However positive for THC today may be playing a role with this. Patient RE has Zofran at home. Lie down Phenergan. Workup to include CT abdomen pelvis without any acute findings. Labs without any significant abnormalities. Patient improved here with IV fluids. Patient given GI referral. Work note provided.    Vanetta Mulders, MD 03/15/16 1125

## 2016-03-15 NOTE — ED Notes (Signed)
Pt reports waking up with n/v and dizziness. Reports mild diarrhea and states "I need to see a stomach doctor because this happens all the time". NAD at triage.

## 2016-09-25 ENCOUNTER — Encounter (HOSPITAL_BASED_OUTPATIENT_CLINIC_OR_DEPARTMENT_OTHER): Payer: Self-pay | Admitting: *Deleted

## 2016-09-25 ENCOUNTER — Emergency Department (HOSPITAL_BASED_OUTPATIENT_CLINIC_OR_DEPARTMENT_OTHER)
Admission: EM | Admit: 2016-09-25 | Discharge: 2016-09-25 | Disposition: A | Discharge status: Home / Self Care | Payer: BLUE CROSS/BLUE SHIELD | Attending: Dermatology | Admitting: Dermatology

## 2016-09-25 DIAGNOSIS — R05 Cough: Secondary | ICD-10-CM | POA: Insufficient documentation

## 2016-09-25 DIAGNOSIS — R0602 Shortness of breath: Secondary | ICD-10-CM | POA: Insufficient documentation

## 2016-09-25 DIAGNOSIS — R52 Pain, unspecified: Secondary | ICD-10-CM | POA: Insufficient documentation

## 2016-09-25 DIAGNOSIS — Z87891 Personal history of nicotine dependence: Secondary | ICD-10-CM | POA: Insufficient documentation

## 2016-09-25 DIAGNOSIS — Z5321 Procedure and treatment not carried out due to patient leaving prior to being seen by health care provider: Secondary | ICD-10-CM | POA: Insufficient documentation

## 2016-09-25 DIAGNOSIS — Z79899 Other long term (current) drug therapy: Secondary | ICD-10-CM | POA: Diagnosis not present

## 2016-09-25 NOTE — ED Triage Notes (Signed)
She has a cough last week. CXR was negative. Body aches. She feels like she cant get a deep breath. Sats 100%. Lungs clear. She is speaking in complete sentences. Ambulatory.

## 2017-01-01 ENCOUNTER — Encounter (HOSPITAL_BASED_OUTPATIENT_CLINIC_OR_DEPARTMENT_OTHER): Payer: Self-pay | Admitting: *Deleted

## 2017-01-01 ENCOUNTER — Emergency Department (HOSPITAL_BASED_OUTPATIENT_CLINIC_OR_DEPARTMENT_OTHER)
Admission: EM | Admit: 2017-01-01 | Discharge: 2017-01-01 | Disposition: A | Discharge status: Home / Self Care | Payer: BLUE CROSS/BLUE SHIELD | Attending: Emergency Medicine | Admitting: Emergency Medicine

## 2017-01-01 DIAGNOSIS — R112 Nausea with vomiting, unspecified: Secondary | ICD-10-CM | POA: Diagnosis present

## 2017-01-01 DIAGNOSIS — F1290 Cannabis use, unspecified, uncomplicated: Secondary | ICD-10-CM | POA: Diagnosis not present

## 2017-01-01 DIAGNOSIS — Z87891 Personal history of nicotine dependence: Secondary | ICD-10-CM | POA: Insufficient documentation

## 2017-01-01 HISTORY — DX: Cyclical vomiting syndrome unrelated to migraine: R11.15

## 2017-01-01 LAB — URINALYSIS, ROUTINE W REFLEX MICROSCOPIC
BILIRUBIN URINE: NEGATIVE
Glucose, UA: NEGATIVE mg/dL
KETONES UR: NEGATIVE mg/dL
Leukocytes, UA: NEGATIVE
Nitrite: NEGATIVE
PH: 8.5 — AB (ref 5.0–8.0)
Protein, ur: NEGATIVE mg/dL
SPECIFIC GRAVITY, URINE: 1.02 (ref 1.005–1.030)

## 2017-01-01 LAB — URINALYSIS, MICROSCOPIC (REFLEX)

## 2017-01-01 LAB — PREGNANCY, URINE: Preg Test, Ur: NEGATIVE

## 2017-01-01 MED ORDER — PROMETHAZINE HCL 25 MG/ML IJ SOLN
25.0000 mg | Freq: Once | INTRAMUSCULAR | Status: AC
  Administered 2017-01-01: 25 mg via INTRAVENOUS
  Filled 2017-01-01: qty 1

## 2017-01-01 MED ORDER — ONDANSETRON 4 MG PO TBDP: 4.0000 mg | ORAL_TABLET | Freq: Once | ORAL | Status: DC

## 2017-01-01 MED ORDER — ONDANSETRON HCL 4 MG/2ML IJ SOLN
4.0000 mg | Freq: Once | INTRAMUSCULAR | Status: AC
  Administered 2017-01-01: 4 mg via INTRAVENOUS
  Filled 2017-01-01: qty 2

## 2017-01-01 MED ORDER — PROMETHAZINE HCL 25 MG PO TABS: 25.0000 mg | ORAL_TABLET | Freq: Four times a day (QID) | ORAL | 0 refills | Status: DC | PRN

## 2017-01-01 MED ORDER — HALOPERIDOL LACTATE 5 MG/ML IJ SOLN
2.0000 mg | Freq: Once | INTRAMUSCULAR | Status: AC
  Administered 2017-01-01: 2 mg via INTRAVENOUS
  Filled 2017-01-01: qty 1

## 2017-01-01 NOTE — ED Notes (Signed)
Pt states she has vomited multiple times since 7p. Denies abd pain or other s/s. Last smoked marijuana earlier last week.

## 2017-01-01 NOTE — ED Notes (Signed)
Pt states she vomited times one after zofran given. Medicated with haldol per order. Will monitor.

## 2017-01-01 NOTE — ED Notes (Signed)
Pt given water to drink per her request.

## 2017-01-01 NOTE — ED Notes (Signed)
Pt states nausea is unchanged. MD aware and orders received.

## 2017-01-01 NOTE — ED Notes (Signed)
When EDP entered the room, the pt began writhing back and forth and then slumped over onto the bedside table almost falling onto the floor. Then she tried to get out of the bed, walking over to the sink stating that she wanted to "get some cool water on her face". Pt was assisted back to the stretcher and given cool compresses. Medicated with Zofran.

## 2017-01-01 NOTE — ED Notes (Signed)
MD with pt  

## 2017-01-01 NOTE — ED Notes (Signed)
Pt states she has a ride home. 

## 2017-01-01 NOTE — ED Provider Notes (Signed)
[facility] Provider Note   CSN: [id number] Arrival date & time: 01/01/17  0134     History   Chief Complaint Chief Complaint  Patient presents with  . Emesis    HPI Julia Price is a 26 y.o. female.  HPI  This is a 26 year old female with history of cyclical vomiting and marijuana use who presents with vomiting. Patient reports that she has been vomiting since this evening. She reports nonbilious, nonbloody emesis. Denies abdominal pain or diarrhea. States that she has vomited multiple times. Initially when asked if she uses marijuana, she states just occasionally; however, upon further questioning and investigation she has previously tested positive for marijuana with similar symptoms. Denies fevers or systemic illness. Last menstrual period is current.  Past Medical History:  Diagnosis Date  . Arthritis   . Intractable cyclical vomiting     Patient Active Problem List   Diagnosis Date Noted  . Acute bronchitis 07/27/2014  . Vaginal pain 09/08/2013  . Eczema 09/08/2013  . Hematemesis 12/06/2011  . Nipple problem 11/30/2011  . Low back strain 06/19/2011  . Sickle-cell trait (HCC) 01/29/2008  . TOBACCO ABUSE, HX OF 01/29/2008    History reviewed. No pertinent surgical history.  OB History    No data available       Home Medications    Prior to Admission medications   Medication Sig Start Date End Date Taking? Authorizing Provider  bismuth subsalicylate (PEPTO BISMOL) 262 MG chewable tablet Chew 524 mg by mouth as needed.    [provider]  cetirizine (ZYRTEC ALLERGY) 10 MG tablet Take 1 tablet (10 mg total) by mouth daily. 05/24/15   Everlene Farrier, PA-C  docusate sodium (COLACE) 100 MG capsule Take 1 capsule (100 mg total) by mouth 2 (two) times daily as needed for mild constipation. 11/03/15   Loren Racer, MD  fluticasone (FLONASE) 50 MCG/ACT nasal spray Place 2 sprays into both nostrils daily. 05/24/15   Everlene Farrier, PA-C    ibuprofen (ADVIL,MOTRIN) 200 MG tablet Take 800-1,200 mg by mouth every 8 (eight) hours as needed. For pain.     [provider]  promethazine (PHENERGAN) 25 MG tablet Take 1 tablet (25 mg total) by mouth every 6 (six) hours as needed for nausea or vomiting. 01/01/17   Horton, Mayer Masker, MD  Pseudoeph-Doxylamine-DM-APAP (NYQUIL PO) Take by mouth.    [provider]    Family History No family history on file.  Social History Social History  Substance Use Topics  . Smoking status: Former Smoker    Packs/day: 1.00    Types: Cigarettes    Quit date: 08/28/2014  . Smokeless tobacco: Never Used  . Alcohol use Yes     Comment: occasional      Allergies   Patient has no known allergies.   Review of Systems Review of Systems  Constitutional: Negative for fever.  Respiratory: Negative for shortness of breath.   Cardiovascular: Negative for chest pain.  Gastrointestinal: Positive for nausea and vomiting. Negative for abdominal pain, constipation and diarrhea.  All other systems reviewed and are negative.    Physical Exam Updated Vital Signs BP 108/67   Pulse 74   Temp 98.3 F (36.8 C) (Oral)   Resp 16   Ht 5\' 3"  (1.6 m)   Wt 130 lb (59 kg)   LMP 12/31/2016   SpO2 97%   BMI 23.03 kg/m   Physical Exam  Constitutional: She is oriented to person, place, and time.  Writhing around  in the bed  HENT:  Head: Normocephalic and atraumatic.  Mucous membranes moist  Cardiovascular: Normal rate, regular rhythm and normal heart sounds.   Pulmonary/Chest: Effort normal and breath sounds normal. No respiratory distress. She has no wheezes.  Abdominal: Soft. Bowel sounds are normal. There is no tenderness. There is no guarding.  Neurological: She is alert and oriented to person, place, and time.  Skin: Skin is warm and dry.  Psychiatric: She has a normal mood and affect.  Nursing note and vitals reviewed.    ED Treatments / Results  Labs (all labs ordered are  listed, but only abnormal results are displayed) Labs Reviewed  URINALYSIS, ROUTINE W REFLEX MICROSCOPIC - Abnormal; Notable for the following:       Result Value   APPearance CLOUDY (*)    pH 8.5 (*)    Hgb urine dipstick TRACE (*)    All other components within normal limits  URINALYSIS, MICROSCOPIC (REFLEX) - Abnormal; Notable for the following:    Bacteria, UA RARE (*)    Squamous Epithelial / LPF 0-5 (*)    All other components within normal limits  PREGNANCY, URINE    EKG  EKG Interpretation None       Radiology No results found.  Procedures Procedures (including critical care time)  Medications Ordered in ED Medications  ondansetron (ZOFRAN) injection 4 mg (4 mg Intravenous Given 01/01/17 0212)  haloperidol lactate (HALDOL) injection 2 mg (2 mg Intravenous Given 01/01/17 0307)  promethazine (PHENERGAN) injection 25 mg (25 mg Intravenous Given 01/01/17 0344)     Initial Impression / Assessment and Plan / ED Course  I have reviewed the triage vital signs and the nursing notes.  Pertinent labs & imaging results that were available during my care of the patient were reviewed by me and considered in my medical decision making (see chart for details).     Patient presents with isolated vomiting for the last several hours. History of the same. Thought to be related to marijuana use. Initially she denied and then reported occasional use. She is writhing in the bed; however, her vital signs are stable and she has no signs or symptoms of dehydration. She provided a urine sample. No ketones noted in the urine. She is not pregnant. Patient was given Zofran initially. She subsequently given Haldol and Phenergan. On recheck at 4 AM, patient is resting comfortably. She is tolerating water. Will discharge home with by mouth Phenergan. I have encouraged abstinence from marijuana as I feel this is likely contributing to the patient's symptoms.  After history, exam, and medical workup I  feel the patient has been appropriately medically screened and is safe for discharge home. Pertinent diagnoses were discussed with the patient. Patient was given return precautions.   Final Clinical Impressions(s) / ED Diagnoses   Final diagnoses:  Nausea and vomiting in adult patient    New Prescriptions New Prescriptions   PROMETHAZINE (PHENERGAN) 25 MG TABLET    Take 1 tablet (25 mg total) by mouth every 6 (six) hours as needed for nausea or vomiting.     Shon BatonHorton, Courtney F, MD 01/01/17 [phone]

## 2017-01-01 NOTE — ED Triage Notes (Signed)
c/o n/v that started last night around 7pm. Denies any abd pain.  Denies any diarrhea or fevers. Denies any urinary symptoms. States she has vomited approx 20 times since 7pm. Denies any other family member being sick.

## 2017-01-01 NOTE — Discharge Instructions (Signed)
You were seen today for nausea and vomiting. You have no significant signs of dehydration. You should abstain from marijuana use as this could be related. Follow-up with gastroenterology if your symptoms persist.

## 2017-01-07 ENCOUNTER — Encounter (HOSPITAL_BASED_OUTPATIENT_CLINIC_OR_DEPARTMENT_OTHER): Payer: Self-pay | Admitting: Emergency Medicine

## 2017-01-07 ENCOUNTER — Emergency Department (HOSPITAL_BASED_OUTPATIENT_CLINIC_OR_DEPARTMENT_OTHER)
Admission: EM | Admit: 2017-01-07 | Discharge: 2017-01-07 | Disposition: A | Discharge status: Home / Self Care | Payer: BLUE CROSS/BLUE SHIELD | Attending: Emergency Medicine | Admitting: Emergency Medicine

## 2017-01-07 DIAGNOSIS — Z87891 Personal history of nicotine dependence: Secondary | ICD-10-CM | POA: Diagnosis not present

## 2017-01-07 DIAGNOSIS — H1131 Conjunctival hemorrhage, right eye: Secondary | ICD-10-CM | POA: Diagnosis not present

## 2017-01-07 DIAGNOSIS — F1290 Cannabis use, unspecified, uncomplicated: Secondary | ICD-10-CM | POA: Diagnosis not present

## 2017-01-07 DIAGNOSIS — B308 Other viral conjunctivitis: Secondary | ICD-10-CM | POA: Insufficient documentation

## 2017-01-07 DIAGNOSIS — B309 Viral conjunctivitis, unspecified: Secondary | ICD-10-CM

## 2017-01-07 DIAGNOSIS — H578 Other specified disorders of eye and adnexa: Secondary | ICD-10-CM | POA: Diagnosis present

## 2017-01-07 MED ORDER — TETRACAINE HCL 0.5 % OP SOLN
2.0000 [drp] | Freq: Once | OPHTHALMIC | Status: AC
  Administered 2017-01-07: 2 [drp] via OPHTHALMIC
  Filled 2017-01-07: qty 4

## 2017-01-07 MED ORDER — FLUORESCEIN SODIUM 0.6 MG OP STRP
1.0000 | ORAL_STRIP | Freq: Once | OPHTHALMIC | Status: AC
  Administered 2017-01-07: 1 via OPHTHALMIC
  Filled 2017-01-07: qty 1

## 2017-01-07 NOTE — ED Triage Notes (Signed)
R eye pain and redness since Tuesday. Pt reports difficulty seeing out of it. Pt was seen and [facility] and dx with pink eye. Pt has been using rx drops and symptoms have worsened.

## 2017-01-07 NOTE — ED Provider Notes (Signed)
[facility]   CSN: [id number] Arrival date & time: 01/07/17  1156     History   Chief Complaint Chief Complaint  Patient presents with  . Eye Problem    HPI Julia Price is a 26 y.o. female.  HPI   26 year old female presents with her girlfriend for concern of right eye irritation, burning, itching, drainage. She was seen in the emergency department on Monday night with nausea vomiting, on on Tuesday she woke up with crusting of her right eye. Reports a clear, yellow, and green drainage at times. Reports associated congestion, sneezing. No fevers or cough. Reports blurred vision from the eye watering. He was seen on Thursday at urgent care and diagnosed with viral conjunctivitis and started on Polytrim drops. Reports she started taking the drops on Friday, and after taking the drops she had significant worsening of her eye pain and irritation. Reports that the redness increased. Denies significant photophobia, fevers, trauma. Reports she is not sure if she could've gotten something into her eye.  Her girlfriend is also in the emergency department for eye irritation and redness.  Past Medical History:  Diagnosis Date  . Arthritis   . Intractable cyclical vomiting     Patient Active Problem List   Diagnosis Date Noted  . Acute bronchitis 07/27/2014  . Vaginal pain 09/08/2013  . Eczema 09/08/2013  . Hematemesis 12/06/2011  . Nipple problem 11/30/2011  . Low back strain 06/19/2011  . Sickle-cell trait (HCC) 01/29/2008  . TOBACCO ABUSE, HX OF 01/29/2008    History reviewed. No pertinent surgical history.  OB History    No data available       Home Medications    Prior to Admission medications   Medication Sig Start Date End Date Taking? Authorizing Provider  cetirizine (ZYRTEC ALLERGY) 10 MG tablet Take 1 tablet (10 mg total) by mouth daily. 05/24/15  Yes Everlene Farrier, PA-C  bismuth subsalicylate (PEPTO BISMOL) 262 MG chewable tablet Chew  524 mg by mouth as needed.    [provider]  docusate sodium (COLACE) 100 MG capsule Take 1 capsule (100 mg total) by mouth 2 (two) times daily as needed for mild constipation. 11/03/15   Loren Racer, MD  fluticasone (FLONASE) 50 MCG/ACT nasal spray Place 2 sprays into both nostrils daily. 05/24/15   Everlene Farrier, PA-C  ibuprofen (ADVIL,MOTRIN) 200 MG tablet Take 800-1,200 mg by mouth every 8 (eight) hours as needed. For pain.     [provider]  promethazine (PHENERGAN) 25 MG tablet Take 1 tablet (25 mg total) by mouth every 6 (six) hours as needed for nausea or vomiting. 01/01/17   Horton, Mayer Masker, MD  Pseudoeph-Doxylamine-DM-APAP (NYQUIL PO) Take by mouth.    [provider]    Family History No family history on file.  Social History Social History  Substance Use Topics  . Smoking status: Former Smoker    Packs/day: 1.00    Types: Cigarettes    Quit date: 08/28/2014  . Smokeless tobacco: Never Used  . Alcohol use Yes     Comment: occasional      Allergies   Patient has no known allergies.   Review of Systems Review of Systems  Constitutional: Negative for fever.  HENT: Negative for sore throat.   Eyes: Positive for pain, discharge, redness, itching and visual disturbance (from tears).  Respiratory: Negative for cough and shortness of breath.   Cardiovascular: Negative for chest pain.  Gastrointestinal: Negative for abdominal pain, nausea and  vomiting.  Genitourinary: Negative for difficulty urinating.  Musculoskeletal: Negative for back pain and neck pain.  Skin: Negative for rash.  Neurological: Positive for headaches (chronic). Negative for syncope.     Physical Exam Updated Vital Signs BP 102/87 (BP Location: Left Arm)   Pulse 90   Temp 99.4 F (37.4 C) (Oral)   Resp 20   Ht 5\' 3"  (1.6 m)   Wt 134 lb (60.8 kg)   LMP 12/31/2016   SpO2 100%   BMI 23.74 kg/m   Physical Exam  Constitutional: She is oriented to person,  place, and time. She appears well-developed and well-nourished. No distress.  HENT:  Head: Normocephalic and atraumatic.  Eyes: EOM are normal. Pupils are equal, round, and reactive to light. Right conjunctiva is injected. Right conjunctiva has a hemorrhage. Right eye exhibits normal extraocular motion. Left eye exhibits normal extraocular motion.  Slit lamp exam:      The right eye shows no corneal abrasion and no hyphema.  Mild irritation/erythema of eyelid No signs of cellulitis   Neck: Normal range of motion.  Cardiovascular: Normal rate, regular rhythm and intact distal pulses.   Pulmonary/Chest: Effort normal. No respiratory distress.  Musculoskeletal: She exhibits no edema or tenderness.  Neurological: She is alert and oriented to person, place, and time.  Skin: Skin is warm and dry. No rash noted. She is not diaphoretic. No erythema.  Nursing note and vitals reviewed.    ED Treatments / Results  Labs (all labs ordered are listed, but only abnormal results are displayed) Labs Reviewed - No data to display  EKG  EKG Interpretation None       Radiology No results found.  Procedures Procedures (including critical care time)  Medications Ordered in ED Medications  tetracaine (PONTOCAINE) 0.5 % ophthalmic solution 2 drop (2 drops Right Eye Given 01/07/17 1252)  fluorescein ophthalmic strip 1 strip (1 strip Right Eye Given 01/07/17 1252)     Initial Impression / Assessment and Plan / ED Course  I have reviewed the triage vital signs and the nursing notes.  Pertinent labs & imaging results that were available during my care of the patient were reviewed by me and considered in my medical decision making (see chart for details).     26 year old female presents with her girlfriend for concern of right eye irritation, burning, itching, drainage. Girlfriend with similar eye symptoms.  Patient without signs of periorbital or orbital cellulitis. No sign of abrasion. Given  her girlfriend has similar symptoms, she has associated nasal congestion, suspect patient most likely has a viral conjunctivitis. She reports that her symptoms significantly worsened with the Polytrim drops she had EGD and on Friday, and given I have low suspicion for bacterial conjunctivitis at this time, and she feels her symptoms are worse after taking the antibiotic drops, recommend discontinuing them.  Patient also has redness of her eye consistent with subconjunctival hemorrhage, most likely from vomiting on Monday.  Discussed using cool compresses, and expectant management of viral conjunctivitis. Discussed that symptoms initially made worse in 3-5 days after initial presentation, and at times can have symptoms up to 2 weeks. Discussed that if she develops worsening erythema surrounding her eye, fevers, blurred vision or other concerns that she should return to the emergency department. Patient discharged in stable condition with understanding of reasons to return.    Final Clinical Impressions(s) / ED Diagnoses   Final diagnoses:  Subconjunctival hemorrhage of right eye  Acute viral conjunctivitis of right eye  New Prescriptions New Prescriptions   No medications on file     Alvira Monday, MD 01/07/17 1327

## 2017-08-20 ENCOUNTER — Emergency Department (HOSPITAL_COMMUNITY): Payer: BLUE CROSS/BLUE SHIELD

## 2017-08-20 ENCOUNTER — Emergency Department (HOSPITAL_COMMUNITY)
Admission: EM | Admit: 2017-08-20 | Discharge: 2017-08-20 | Disposition: A | Discharge status: Home / Self Care | Payer: BLUE CROSS/BLUE SHIELD | Attending: Emergency Medicine | Admitting: Emergency Medicine

## 2017-08-20 ENCOUNTER — Encounter (HOSPITAL_COMMUNITY): Payer: Self-pay | Admitting: Neurology

## 2017-08-20 DIAGNOSIS — D573 Sickle-cell trait: Secondary | ICD-10-CM | POA: Diagnosis not present

## 2017-08-20 DIAGNOSIS — F17210 Nicotine dependence, cigarettes, uncomplicated: Secondary | ICD-10-CM | POA: Insufficient documentation

## 2017-08-20 DIAGNOSIS — M542 Cervicalgia: Secondary | ICD-10-CM | POA: Insufficient documentation

## 2017-08-20 DIAGNOSIS — R079 Chest pain, unspecified: Secondary | ICD-10-CM | POA: Diagnosis not present

## 2017-08-20 DIAGNOSIS — Y939 Activity, unspecified: Secondary | ICD-10-CM | POA: Insufficient documentation

## 2017-08-20 DIAGNOSIS — Y92410 Unspecified street and highway as the place of occurrence of the external cause: Secondary | ICD-10-CM | POA: Insufficient documentation

## 2017-08-20 DIAGNOSIS — Z5329 Procedure and treatment not carried out because of patient's decision for other reasons: Secondary | ICD-10-CM

## 2017-08-20 DIAGNOSIS — Y998 Other external cause status: Secondary | ICD-10-CM | POA: Diagnosis not present

## 2017-08-20 DIAGNOSIS — R29898 Other symptoms and signs involving the musculoskeletal system: Secondary | ICD-10-CM

## 2017-08-20 DIAGNOSIS — Z5320 Procedure and treatment not carried out because of patient's decision for unspecified reasons: Secondary | ICD-10-CM

## 2017-08-20 DIAGNOSIS — R531 Weakness: Secondary | ICD-10-CM | POA: Diagnosis not present

## 2017-08-20 MED ORDER — IBUPROFEN 400 MG PO TABS: 600.0000 mg | ORAL_TABLET | Freq: Once | ORAL | Status: DC

## 2017-08-20 NOTE — ED Notes (Addendum)
Pt sitting in chair . Declines c-collar or any further imaging or exam by this nurse. Will inform provider. Pt states pain 10/10. States"I'm not going to stay here 6 hours".
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
## 2017-08-20 NOTE — ED Notes (Signed)
Patient transported to X-ray 
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
## 2017-08-20 NOTE — ED Notes (Addendum)
Provider at [facility]ie and she does place collar for pt. Pt urged to wear collar 24/7. Pt urged to return for any new or worsening symptoms. Pt informed that leaving AMA could have life long results including paralysis and death. Pt also urge to return at any point and we would be happy to treat.

## 2017-08-20 NOTE — ED Notes (Signed)
Pt ambulates easily to room 5.

## 2018-04-28 ENCOUNTER — Inpatient Hospital Stay (HOSPITAL_COMMUNITY)
Admission: AD | Admit: 2018-04-28 | Discharge: 2018-04-28 | Disposition: A | Discharge status: Home / Self Care | Payer: BLUE CROSS/BLUE SHIELD | Source: Ambulatory Visit | Attending: Obstetrics and Gynecology | Admitting: Obstetrics and Gynecology

## 2018-04-28 ENCOUNTER — Encounter (HOSPITAL_COMMUNITY): Payer: Self-pay | Admitting: *Deleted

## 2018-04-28 DIAGNOSIS — M1990 Unspecified osteoarthritis, unspecified site: Secondary | ICD-10-CM | POA: Insufficient documentation

## 2018-04-28 DIAGNOSIS — N939 Abnormal uterine and vaginal bleeding, unspecified: Secondary | ICD-10-CM

## 2018-04-28 DIAGNOSIS — F17210 Nicotine dependence, cigarettes, uncomplicated: Secondary | ICD-10-CM | POA: Insufficient documentation

## 2018-04-28 LAB — WET PREP, GENITAL
CLUE CELLS WET PREP: NONE SEEN
TRICH WET PREP: NONE SEEN
Yeast Wet Prep HPF POC: NONE SEEN

## 2018-04-28 LAB — CBC
HCT: 37.3 % (ref 36.0–46.0)
HEMOGLOBIN: 12.4 g/dL (ref 12.0–15.0)
MCH: 28.7 pg (ref 26.0–34.0)
MCHC: 33.2 g/dL (ref 30.0–36.0)
MCV: 86.3 fL (ref 78.0–100.0)
Platelets: 231 10*3/uL (ref 150–400)
RBC: 4.32 MIL/uL (ref 3.87–5.11)
RDW: 13.5 % (ref 11.5–15.5)
WBC: 8.1 10*3/uL (ref 4.0–10.5)

## 2018-04-28 LAB — URINALYSIS, ROUTINE W REFLEX MICROSCOPIC
Bilirubin Urine: NEGATIVE
GLUCOSE, UA: NEGATIVE mg/dL
Ketones, ur: NEGATIVE mg/dL
LEUKOCYTES UA: NEGATIVE
NITRITE: NEGATIVE
Protein, ur: NEGATIVE mg/dL
Specific Gravity, Urine: 1.013 (ref 1.005–1.030)
pH: 5 (ref 5.0–8.0)

## 2018-04-28 LAB — POCT PREGNANCY, URINE: PREG TEST UR: NEGATIVE

## 2018-04-28 LAB — HIV ANTIBODY (ROUTINE TESTING W REFLEX): HIV Screen 4th Generation wRfx: NONREACTIVE

## 2018-04-28 MED ORDER — KETOROLAC TROMETHAMINE 60 MG/2ML IM SOLN: 60.0000 mg | INTRAMUSCULAR | Status: DC

## 2018-04-28 MED ORDER — MEGESTROL ACETATE 40 MG PO TABS: 40.0000 mg | ORAL_TABLET | Freq: Every day | ORAL | 1 refills | Status: DC

## 2018-04-28 NOTE — Discharge Instructions (Signed)
In late February 2020, the [facility] will be moving to the [place] campus. At that time, the MAU (Maternity Admissions Unit), where you are being seen today, will no longer take care of non-pregnant patients. We strongly encourage you to find a doctor's office before that time, so that you can be seen with any GYN concerns, like vaginal discharge, urinary tract infection, etc.. in a timely manner. ° °In order to make an office visit more convenient, the [facility] will be offering evening hours with same-day appointments, walk-in appointments and scheduled appointments available during this time. ° °[facility] Hours: °Monday - 8am - 7:30 pm with walk-in between 4pm- 7:30 pm °Tuesday - 8 am - 5 pm open late and accepting walk-ins from 4pm - 7:30pm °Wednesday - 8 am - 5 pm open late and accepting walk-ins from 4pm - 7:30pm °Thursday 8 am - 5 pm (starting 05/30/18 we will be open late and accepting walk-ins from 4pm - 7:30pm) °Friday 8 am - 5 pm ° °For an appointment please call the [facility] at [phone] ° °For urgent needs, [facility] is also available for management of urgent GYN complaints such as vaginal discharge or urinary tract infections. ° ° ° ° ° °

## 2018-04-28 NOTE — MAU Note (Signed)
LMP 04/02/18. After period stopped have had spotting off and on. Past 3 days have had heavier vag bleeding. Some abdominal cramping

## 2018-04-28 NOTE — MAU Note (Signed)
Pt reports waking up in a pool of blood this morning. Has some cramping which is worse after the pelvic exam.

## 2018-04-28 NOTE — MAU Note (Signed)
Pt offered pain medicine but stated she does not want anything

## 2018-04-28 NOTE — Progress Notes (Signed)
Rolitta Dawson CNM in earlier to discuss test results and d/c plan. Written and verbal d/c instructions given and understanding voiced. 

## 2018-04-28 NOTE — MAU Provider Note (Signed)
History     CSN: [id number]  Arrival date and time: 04/28/18 0415   First Provider Initiated Contact with Patient 04/28/18 0444      Chief Complaint  Patient presents with  . Vaginal Bleeding   HPI  Ms.  Julia Price is a 27 y.o. year old female who presents to MAU reporting VB for 3 days. She states her LMP was 8/6, ended 8/12, then she started spotted 8/19 and has continued to spot until 3 days ago. She reports the VB that started 3 days ago is like a period. She has had to change 3-4 tampons/day since her VB became heavy. She reports some abdominal cramping. She is sexually active in a same sex off and on relationship "for years."  Past Medical History:  Diagnosis Date  . Arthritis   . Intractable cyclical vomiting     Past Surgical History:  Procedure Laterality Date  . NO PAST SURGERIES      No family history on file.  Social History   Tobacco Use  . Smoking status: Current Some Day Smoker    Packs/day: 1.00    Types: Cigarettes    Last attempt to quit: 08/28/2014    Years since quitting: 3.6  . Smokeless tobacco: Never Used  Substance Use Topics  . Alcohol use: Yes    Comment: occasional   . Drug use: Yes    Types: Marijuana    Comment: very occasional     Allergies: No Known Allergies  Medications Prior to Admission  Medication Sig Dispense Refill Last Dose  . ibuprofen (ADVIL,MOTRIN) 200 MG tablet Take 800-1,200 mg by mouth every 8 (eight) hours as needed. For pain.    04/27/2018 at Unknown time  . bismuth subsalicylate (PEPTO BISMOL) 262 MG chewable tablet Chew 524 mg by mouth as needed.     . cetirizine (ZYRTEC ALLERGY) 10 MG tablet Take 1 tablet (10 mg total) by mouth daily. 30 tablet 1   . docusate sodium (COLACE) 100 MG capsule Take 1 capsule (100 mg total) by mouth 2 (two) times daily as needed for mild constipation. 60 capsule 0   . fluticasone (FLONASE) 50 MCG/ACT nasal spray Place 2 sprays into both nostrils daily. 16 g 0   . promethazine  (PHENERGAN) 25 MG tablet Take 1 tablet (25 mg total) by mouth every 6 (six) hours as needed for nausea or vomiting. 15 tablet 0   . Pseudoeph-Doxylamine-DM-APAP (NYQUIL PO) Take by mouth.       Review of Systems  Constitutional: Negative.   HENT: Negative.   Eyes: Negative.   Respiratory: Negative.   Cardiovascular: Negative.   Gastrointestinal: Positive for abdominal pain (cramping).  Endocrine: Negative.   Genitourinary: Positive for vaginal bleeding.  Musculoskeletal: Negative.   Skin: Negative.   Allergic/Immunologic: Negative.   Neurological: Negative.   Psychiatric/Behavioral: Negative.    Physical Exam   Blood pressure 125/76, pulse 93, temperature 98.3 F (36.8 C), resp. rate 18, height 5\' 3"  (1.6 m), weight 65.8 kg, last menstrual period 04/02/2018.  Physical Exam  Nursing note and vitals reviewed. Constitutional: She is oriented to person, place, and time. She appears well-developed and well-nourished.  HENT:  Head: Normocephalic and atraumatic.  Eyes: Pupils are equal, round, and reactive to light.  Neck: Normal range of motion.  Cardiovascular: Normal rate.  Respiratory: Effort normal.  GI: Soft.  Genitourinary:  Genitourinary Comments: Uterus: non-tender, SE: cervix is smooth, pink, no lesions, moderate amt of bright, red blood in vaginal vault --  WP, GC/CT done, closed/long/firm, no CMT or friability, no adnexal tenderness   Musculoskeletal: Normal range of motion.  Neurological: She is alert and oriented to person, place, and time.  Skin: Skin is warm and dry.  Psychiatric: She has a normal mood and affect. Her behavior is normal. Judgment and thought content normal.    MAU Course  Procedures  MDM CCUA UPT Wet Prep GC/CT -- pending  HIV CBC Toradol 60 mg IM -- relieved pain  Results for orders placed or performed during the hospital encounter of 04/28/18 (from the past 24 hour(s))  Wet prep, genital     Status: Abnormal   Collection Time:  04/28/18  4:44 AM  Result Value Ref Range   Yeast Wet Prep HPF POC NONE SEEN NONE SEEN   Trich, Wet Prep NONE SEEN NONE SEEN   Clue Cells Wet Prep HPF POC NONE SEEN NONE SEEN   WBC, Wet Prep HPF POC MODERATE (A) NONE SEEN   Sperm NOT DONE   Urinalysis, Routine w reflex microscopic     Status: Abnormal   Collection Time: 04/28/18  4:54 AM  Result Value Ref Range   Color, Urine YELLOW YELLOW   APPearance CLEAR CLEAR   Specific Gravity, Urine 1.013 1.005 - 1.030   pH 5.0 5.0 - 8.0   Glucose, UA NEGATIVE NEGATIVE mg/dL   Hgb urine dipstick LARGE (A) NEGATIVE   Bilirubin Urine NEGATIVE NEGATIVE   Ketones, ur NEGATIVE NEGATIVE mg/dL   Protein, ur NEGATIVE NEGATIVE mg/dL   Nitrite NEGATIVE NEGATIVE   Leukocytes, UA NEGATIVE NEGATIVE   RBC / HPF >50 (H) 0 - 5 RBC/hpf   WBC, UA 0-5 0 - 5 WBC/hpf   Bacteria, UA RARE (A) NONE SEEN   Squamous Epithelial / LPF 0-5 0 - 5   Mucus PRESENT   Pregnancy, urine POC     Status: None   Collection Time: 04/28/18  4:58 AM  Result Value Ref Range   Preg Test, Ur NEGATIVE NEGATIVE  CBC     Status: None   Collection Time: 04/28/18  5:37 AM  Result Value Ref Range   WBC 8.1 4.0 - 10.5 K/uL   RBC 4.32 3.87 - 5.11 MIL/uL   Hemoglobin 12.4 12.0 - 15.0 g/dL   HCT 16.1 09.6 - 04.5 %   MCV 86.3 78.0 - 100.0 fL   MCH 28.7 26.0 - 34.0 pg   MCHC 33.2 30.0 - 36.0 g/dL   RDW 40.9 81.1 - 91.4 %   Platelets 231 150 - 400 K/uL    Assessment and Plan  Abnormal uterine bleeding (AUB) - Plan: Discharge patient - Rx for Megace 40 mg daily until seen by provider at [facility] (pt's choice of provider) - Information provided on AUB & Megace - Message sent to [facility] to get pt scheduled with MD for F/U - Note given to be OOW for 04/28/2018 only - Discharge home - Patient verbalized an understanding of the plan of care and agrees.    Raelyn Mora, MSN, CNM 04/28/2018, 4:44 AM

## 2018-04-30 LAB — GC/CHLAMYDIA PROBE AMP (~~LOC~~) NOT AT ARMC
CHLAMYDIA, DNA PROBE: NEGATIVE
Neisseria Gonorrhea: NEGATIVE

## 2018-05-15 ENCOUNTER — Ambulatory Visit: Payer: Self-pay | Admitting: Obstetrics and Gynecology

## 2018-06-19 ENCOUNTER — Other Ambulatory Visit: Payer: Self-pay

## 2018-06-19 ENCOUNTER — Encounter (HOSPITAL_BASED_OUTPATIENT_CLINIC_OR_DEPARTMENT_OTHER): Payer: Self-pay | Admitting: Emergency Medicine

## 2018-06-19 ENCOUNTER — Emergency Department (HOSPITAL_BASED_OUTPATIENT_CLINIC_OR_DEPARTMENT_OTHER)
Admission: EM | Admit: 2018-06-19 | Discharge: 2018-06-19 | Disposition: A | Discharge status: Home / Self Care | Payer: Self-pay | Attending: Emergency Medicine | Admitting: Emergency Medicine

## 2018-06-19 DIAGNOSIS — F17210 Nicotine dependence, cigarettes, uncomplicated: Secondary | ICD-10-CM | POA: Insufficient documentation

## 2018-06-19 DIAGNOSIS — N939 Abnormal uterine and vaginal bleeding, unspecified: Secondary | ICD-10-CM | POA: Insufficient documentation

## 2018-06-19 DIAGNOSIS — Z79899 Other long term (current) drug therapy: Secondary | ICD-10-CM | POA: Insufficient documentation

## 2018-06-19 LAB — CBC
HEMATOCRIT: 38.4 % (ref 36.0–46.0)
Hemoglobin: 12.8 g/dL (ref 12.0–15.0)
MCH: 28.8 pg (ref 26.0–34.0)
MCHC: 33.3 g/dL (ref 30.0–36.0)
MCV: 86.3 fL (ref 80.0–100.0)
NRBC: 0 % (ref 0.0–0.2)
Platelets: 225 10*3/uL (ref 150–400)
RBC: 4.45 MIL/uL (ref 3.87–5.11)
RDW: 13.3 % (ref 11.5–15.5)
WBC: 5.3 10*3/uL (ref 4.0–10.5)

## 2018-06-19 LAB — URINALYSIS, ROUTINE W REFLEX MICROSCOPIC
Bilirubin Urine: NEGATIVE
Glucose, UA: NEGATIVE mg/dL
Hgb urine dipstick: NEGATIVE
Ketones, ur: NEGATIVE mg/dL
LEUKOCYTES UA: NEGATIVE
NITRITE: NEGATIVE
PH: 6.5 (ref 5.0–8.0)
Protein, ur: NEGATIVE mg/dL
SPECIFIC GRAVITY, URINE: 1.01 (ref 1.005–1.030)

## 2018-06-19 LAB — PREGNANCY, URINE: Preg Test, Ur: NEGATIVE

## 2018-06-19 MED ORDER — MEGESTROL ACETATE 40 MG PO TABS: 40.0000 mg | ORAL_TABLET | Freq: Every day | ORAL | 1 refills | Status: DC

## 2018-06-19 NOTE — Discharge Instructions (Addendum)
Your hemoglobin is normal.  You are not anemic.  I have sent another prescription for Megace to your pharmacy.  Please take this as instructed and follow-up with a gynecologist as soon as you are able.  If you experience fevers, chills, vomiting, vaginal pain, abdominal pain, shortness of breath, worsening fatigue or lightheadedness please see a doctor.

## 2018-06-19 NOTE — ED Provider Notes (Signed)
[facility] EMERGENCY DEPARTMENT Provider Note   CSN: [id number] Arrival date & time: 06/19/18  4098     History   Chief Complaint Chief Complaint  Patient presents with  . Vaginal Bleeding    HPI Julia Price is a 27 y.o. female with history of abnormal uterine bleeding who presents for evaluation of heavy vaginal bleeding.  Her menstrual cycles have always been heavy and irregular but since August she has had persistent heavy vaginal bleeding.  She reports using super tampons and maxipads and needing to change them every 3 hours.  She endorses passing clots.  She denies fevers or chills and only has sex with females.  She was evaluated at [facility] a month and a half ago and given a prescription for Megace.  She states this medication helped decrease her amount of bleeding but she ran out of it.  She recently switched jobs and her health insurance does not activate until the end of December.   She had one pregnancy 10 years ago and delivery was complicated by postpartum hemorrhage.  She denies frequent nosebleed or gum bleeds or joint bleeds.  She she has an aunt who required a hysterectomy for normal uterine bleeding.  ROS is negative for fevers, chills, vomiting, significant abdominal or pelvic pain.  She does endorse lightheadedness/dizziness upon standing, nausea, abdominal cramping.  HPI  Past Medical History:  Diagnosis Date  . Arthritis   . Intractable cyclical vomiting     Patient Active Problem List   Diagnosis Date Noted  . Abnormal uterine bleeding (AUB) 04/28/2018  . Acute bronchitis 07/27/2014  . Vaginal pain 09/08/2013  . Eczema 09/08/2013  . Hematemesis 12/06/2011  . Nipple problem 11/30/2011  . Low back strain 06/19/2011  . Sickle-cell trait (HCC) 01/29/2008  . TOBACCO ABUSE, HX OF 01/29/2008    Past Surgical History:  Procedure Laterality Date  . NO PAST SURGERIES       OB History    Gravida  1   Para  1   Term  1   Preterm      AB      Living  1     SAB      TAB      Ectopic      Multiple      Live Births               Home Medications    Prior to Admission medications   Medication Sig Start Date End Date Taking? Authorizing Provider  ibuprofen (ADVIL,MOTRIN) 200 MG tablet Take 800-1,200 mg by mouth every 8 (eight) hours as needed. For pain.     [provider]  megestrol (MEGACE) 40 MG tablet Take 1 tablet (40 mg total) by mouth daily. 06/19/18   Ali Lowe, MD    Family History No family history on file.  Social History Social History   Tobacco Use  . Smoking status: Current Some Day Smoker    Packs/day: 1.00    Types: Cigarettes    Last attempt to quit: 08/28/2014    Years since quitting: 3.8  . Smokeless tobacco: Never Used  Substance Use Topics  . Alcohol use: Yes  . Drug use: Yes    Types: Marijuana    Comment: very occasional      Allergies   Patient has no known allergies.   Review of Systems Review of Systems See HPI  Physical Exam Updated Vital Signs BP 116/76 (BP Location: Right Arm)  Pulse 88   Temp 98.4 F (36.9 C) (Oral)   Resp 16   Ht 5\' 3"  (1.6 m)   Wt 63.5 kg   LMP 04/02/2018   SpO2 100%   BMI 24.80 kg/m   Physical Exam Gen: Well appearing, NAD CV: RRR, no murmurs Pulm: Normal effort, CTA throughout, no wheezing Abd: Soft, NT, ND, normal BS.  Ext: Warm, no edema, normal joints  ED Treatments / Results  Labs (all labs ordered are listed, but only abnormal results are displayed) Labs Reviewed  URINALYSIS, ROUTINE W REFLEX MICROSCOPIC  PREGNANCY, URINE  CBC    EKG None  Radiology No results found.  Procedures Procedures (including critical care time)  Medications Ordered in ED Medications - No data to display   Initial Impression / Assessment and Plan / ED Course  I have reviewed the triage vital signs and the nursing notes.  Pertinent labs & imaging results that were available during my care of  the patient were reviewed by me and considered in my medical decision making (see chart for details).     27 year old female with history of heavy periods presents with 2 months of persistent vaginal bleeding.  Vital signs are stable and she appears well.  Exam is unremarkable.  She has had sex with one female partner in the past several months.  Pregnancy test is negative.  Doubt ectopic.  She denies hirsutism or dyspareunia.  Doubt endometriosis.  She denies dysuria, vaginal itching or significant abdominal pain.  I do not think a pelvic exam is needed at this time.  CBC shows normal hemoglobin.  Patient is safe to discharge.  Given another 80-month prescription for Megace and instructions to follow-up with OB/GYN as soon as she is able.  Final Clinical Impressions(s) / ED Diagnoses   Final diagnoses:  Abnormal uterine bleeding (AUB)    ED Discharge Orders         Ordered    megestrol (MEGACE) 40 MG tablet  Daily     06/19/18 0859           Ali Lowe, MD 06/19/18 1013    Blane Ohara, MD 06/21/18 [phone]

## 2018-06-19 NOTE — ED Notes (Signed)
ED Provider at bedside. 

## 2018-06-19 NOTE — ED Triage Notes (Addendum)
Vag bleeding since 04/02/18, does not have a OB-GYN MD, took BCP in Sept and bleeding stopped for 2 weeks, is not longer on BCP

## 2018-08-17 ENCOUNTER — Other Ambulatory Visit (HOSPITAL_BASED_OUTPATIENT_CLINIC_OR_DEPARTMENT_OTHER): Payer: Self-pay | Admitting: Internal Medicine

## 2018-09-03 ENCOUNTER — Encounter: Payer: Self-pay | Admitting: *Deleted

## 2018-09-05 ENCOUNTER — Ambulatory Visit (INDEPENDENT_AMBULATORY_CARE_PROVIDER_SITE_OTHER): Payer: Self-pay | Admitting: Obstetrics and Gynecology

## 2018-09-05 ENCOUNTER — Encounter: Payer: Self-pay | Admitting: Obstetrics and Gynecology

## 2018-09-05 VITALS — BP 114/75 | HR 92 | Ht 63.0 in | Wt 148.7 lb

## 2018-09-05 DIAGNOSIS — N939 Abnormal uterine and vaginal bleeding, unspecified: Secondary | ICD-10-CM

## 2018-09-05 DIAGNOSIS — Z124 Encounter for screening for malignant neoplasm of cervix: Secondary | ICD-10-CM

## 2018-09-05 MED ORDER — MEGESTROL ACETATE 40 MG PO TABS: 40.0000 mg | ORAL_TABLET | Freq: Every day | ORAL | 3 refills | Status: DC

## 2018-09-05 NOTE — Progress Notes (Signed)
28 yo P1 here for the evaluation of DUB. Patient reports a history of monthly period lasting 6-7 days. She reports onset of vaginal bleeding in August which never stopped until she was prescribed Megace during an ED visit. She states some improvement with the megace but persistent irregular spotting. She stopped taking megace in December (as rx completed) and reports no further episode of vaginal bleeding since 08/30/2018. Patient is sexually active without contraception in a same-sex relationship. She denies any pelvic pain or abnormal discharge  Past Medical History:  Diagnosis Date  . Arthritis   . Intractable cyclical vomiting    Past Surgical History:  Procedure Laterality Date  . NO PAST SURGERIES     History reviewed. No pertinent family history. Social History   Tobacco Use  . Smoking status: Current Some Day Smoker    Packs/day: 1.00    Types: Cigarettes    Last attempt to quit: 08/28/2014    Years since quitting: 4.0  . Smokeless tobacco: Never Used  Substance Use Topics  . Alcohol use: Yes  . Drug use: Yes    Types: Marijuana    Comment: very occasional    ROS See pertinent in HPI  Blood pressure 114/75, pulse 92, height 5\' 3"  (1.6 m), weight 148 lb 11.2 oz (67.4 kg), last menstrual period 07/22/2018. GENERAL: Well-developed, well-nourished female in no acute distress.  ABDOMEN: Soft, nontender, nondistended. No organomegaly. PELVIC: Normal external female genitalia. Vagina is pink and rugated.  Normal discharge. Normal appearing cervix. Uterus is normal in size. No adnexal mass or tenderness. EXTREMITIES: No cyanosis, clubbing, or edema, 2+ distal pulses.  A/P 28 yo with DUB - Pap smear collected per patient request - Pelvic ultrasound ordered - Discussed medical management with contraception pending ultrasound but patient is not interested. Discussed surgical management with endometrial ablation pending ultrasound which is an option patient may consider. She states  that all women in her family has had a hysterectomy. - patient will be contacted with results

## 2018-09-05 NOTE — Patient Instructions (Signed)
Levonorgestrel intrauterine device (IUD) What is this medicine? LEVONORGESTREL IUD (LEE voe nor jes trel) is a contraceptive (birth control) device. The device is placed inside the uterus by a healthcare professional. It is used to prevent pregnancy. This device can also be used to treat heavy bleeding that occurs during your period. This medicine may be used for other purposes; ask your health care provider or pharmacist if you have questions. COMMON BRAND NAME(S): Kyleena, LILETTA, Mirena, Skyla What should I tell my health care provider before I take this medicine? They need to know if you have any of these conditions: -abnormal Pap smear -cancer of the breast, uterus, or cervix -diabetes -endometritis -genital or pelvic infection now or in the past -have more than one sexual partner or your partner has more than one partner -heart disease -history of an ectopic or tubal pregnancy -immune system problems -IUD in place -liver disease or tumor -problems with blood clots or take blood-thinners -seizures -use intravenous drugs -uterus of unusual shape -vaginal bleeding that has not been explained -an unusual or allergic reaction to levonorgestrel, other hormones, silicone, or polyethylene, medicines, foods, dyes, or preservatives -pregnant or trying to get pregnant -breast-feeding How should I use this medicine? This device is placed inside the uterus by a health care professional. Talk to your pediatrician regarding the use of this medicine in children. Special care may be needed. Overdosage: If you think you have taken too much of this medicine contact a poison control center or emergency room at once. NOTE: This medicine is only for you. Do not share this medicine with others. What if I miss a dose? This does not apply. Depending on the brand of device you have inserted, the device will need to be replaced every 3 to 5 years if you wish to continue using this type of birth control.  What may interact with this medicine? Do not take this medicine with any of the following medications: -amprenavir -bosentan -fosamprenavir This medicine may also interact with the following medications: -aprepitant -armodafinil -barbiturate medicines for inducing sleep or treating seizures -bexarotene -boceprevir -griseofulvin -medicines to treat seizures like carbamazepine, ethotoin, felbamate, oxcarbazepine, phenytoin, topiramate -modafinil -pioglitazone -rifabutin -rifampin -rifapentine -some medicines to treat HIV infection like atazanavir, efavirenz, indinavir, lopinavir, nelfinavir, tipranavir, ritonavir -St. John's wort -warfarin This list may not describe all possible interactions. Give your health care provider a list of all the medicines, herbs, non-prescription drugs, or dietary supplements you use. Also tell them if you smoke, drink alcohol, or use illegal drugs. Some items may interact with your medicine. What should I watch for while using this medicine? Visit your doctor or health care professional for regular check ups. See your doctor if you or your partner has sexual contact with others, becomes HIV positive, or gets a sexual transmitted disease. This product does not protect you against HIV infection (AIDS) or other sexually transmitted diseases. You can check the placement of the IUD yourself by reaching up to the top of your vagina with clean fingers to feel the threads. Do not pull on the threads. It is a good habit to check placement after each menstrual period. Call your doctor right away if you feel more of the IUD than just the threads or if you cannot feel the threads at all. The IUD may come out by itself. You may become pregnant if the device comes out. If you notice that the IUD has come out use a backup birth control method like condoms and call your   health care provider. Using tampons will not change the position of the IUD and are okay to use during your  period. This IUD can be safely scanned with magnetic resonance imaging (MRI) only under specific conditions. Before you have an MRI, tell your healthcare provider that you have an IUD in place, and which type of IUD you have in place. What side effects may I notice from receiving this medicine? Side effects that you should report to your doctor or health care professional as soon as possible: -allergic reactions like skin rash, itching or hives, swelling of the face, lips, or tongue -fever, flu-like symptoms -genital sores -high blood pressure -no menstrual period for 6 weeks during use -pain, swelling, warmth in the leg -pelvic pain or tenderness -severe or sudden headache -signs of pregnancy -stomach cramping -sudden shortness of breath -trouble with balance, talking, or walking -unusual vaginal bleeding, discharge -yellowing of the eyes or skin Side effects that usually do not require medical attention (report to your doctor or health care professional if they continue or are bothersome): -acne -breast pain -change in sex drive or performance -changes in weight -cramping, dizziness, or faintness while the device is being inserted -headache -irregular menstrual bleeding within first 3 to 6 months of use -nausea This list may not describe all possible side effects. Call your doctor for medical advice about side effects. You may report side effects to FDA at 1-800-FDA-1088. Where should I keep my medicine? This does not apply. NOTE: This sheet is a summary. It may not cover all possible information. If you have questions about this medicine, talk to your doctor, pharmacist, or health care provider.  2019 Elsevier/Gold Standard (2016-05-26 14:14:56)   Endometrial Ablation Endometrial ablation is a procedure that destroys the thin inner layer of the lining of the uterus (endometrium). This procedure may be done:  To stop heavy periods.  To stop bleeding that is causing anemia.   To control irregular bleeding.  To treat bleeding caused by small tumors (fibroids) in the endometrium. This procedure is often an alternative to major surgery, such as removal of the uterus and cervix (hysterectomy). As a result of this procedure:  You may not be able to have children. However, if you are premenopausal (you have not gone through menopause): ? You may still have a small chance of getting pregnant. ? You will need to use a reliable method of birth control after the procedure to prevent pregnancy.  You may stop having a menstrual period, or you may have only a small amount of bleeding during your period. Menstruation may return several years after the procedure. Tell a health care provider about:  Any allergies you have.  All medicines you are taking, including vitamins, herbs, eye drops, creams, and over-the-counter medicines.  Any problems you or family members have had with the use of anesthetic medicines.  Any blood disorders you have.  Any surgeries you have had.  Any medical conditions you have. What are the risks? Generally, this is a safe procedure. However, problems may occur, including:  A hole (perforation) in the uterus or bowel.  Infection of the uterus, bladder, or vagina.  Bleeding.  Damage to other structures or organs.  An air bubble in the lung (air embolus).  Problems with pregnancy after the procedure.  Failure of the procedure.  Decreased ability to diagnose cancer in the endometrium. What happens before the procedure?  You will have tests of your endometrium to make sure there are no pre-cancerous cells   or cancer cells present.  You may have an ultrasound of the uterus.  You may be given medicines to thin the endometrium.  Ask your health care provider about: ? Changing or stopping your regular medicines. This is especially important if you take diabetes medicines or blood thinners. ? Taking medicines such as aspirin and  ibuprofen. These medicines can thin your blood. Do not take these medicines before your procedure if your doctor tells you not to.  Plan to have someone take you home from the hospital or clinic. What happens during the procedure?   You will lie on an exam table with your feet and legs supported as in a pelvic exam.  To lower your risk of infection: ? Your health care team will wash or sanitize their hands and put on germ-free (sterile) gloves. ? Your genital area will be washed with soap.  An IV tube will be inserted into one of your veins.  You will be given a medicine to help you relax (sedative).  A surgical instrument with a light and camera (resectoscope) will be inserted into your vagina and moved into your uterus. This allows your surgeon to see inside your uterus.  Endometrial tissue will be removed using one of the following methods: ? Radiofrequency. This method uses a radiofrequency-alternating electric current to remove the endometrium. ? Cryotherapy. This method uses extreme cold to freeze the endometrium. ? Heated-free liquid. This method uses a heated saltwater (saline) solution to remove the endometrium. ? Microwave. This method uses high-energy microwaves to heat up the endometrium and remove it. ? Thermal balloon. This method involves inserting a catheter with a balloon tip into the uterus. The balloon tip is filled with heated fluid to remove the endometrium. The procedure may vary among health care providers and hospitals. What happens after the procedure?  Your blood pressure, heart rate, breathing rate, and blood oxygen level will be monitored until the medicines you were given have worn off.  As tissue healing occurs, you may notice vaginal bleeding for 4-6 weeks after the procedure. You may also experience: ? Cramps. ? Thin, watery vaginal discharge that is light pink or brown in color. ? A need to urinate more frequently than usual. ? Nausea.  Do not drive  for 24 hours if you were given a sedative.  Do not have sex or insert anything into your vagina until your health care provider approves. Summary  Endometrial ablation is done to treat the many causes of heavy menstrual bleeding.  The procedure may be done only after medications have been tried to control the bleeding.  Plan to have someone take you home from the hospital or clinic. This information is not intended to replace advice given to you by your health care provider. Make sure you discuss any questions you have with your health care provider. Document Released: 06/23/2004 Document Revised: 01/29/2018 Document Reviewed: 08/31/2016 Elsevier Interactive Patient Education  2019 Elsevier Inc.  

## 2018-09-10 ENCOUNTER — Telehealth: Payer: Self-pay

## 2018-09-10 LAB — CYTOLOGY - PAP

## 2018-09-10 NOTE — Telephone Encounter (Signed)
Notified pt of abnormal pap results and the need for a colpo.  I explained to the pt what a colpo is and that a front office staff will call her with an appt.  Pt stated understanding.

## 2018-09-10 NOTE — Telephone Encounter (Addendum)
-----   Message from Catalina Antigua, MD sent at 09/10/2018  3:55 PM EST ----- Please inform patient of abnormal pap smear and need for colposcopy. Please schedule colpo appointment  Attempted to contact pt was unable to LM due to VM box not set up yet.

## 2018-09-11 ENCOUNTER — Ambulatory Visit (HOSPITAL_COMMUNITY)
Admission: RE | Admit: 2018-09-11 | Discharge: 2018-09-11 | Disposition: A | Discharge status: Home / Self Care | Payer: Self-pay | Source: Ambulatory Visit | Attending: Obstetrics and Gynecology | Admitting: Obstetrics and Gynecology

## 2018-09-11 ENCOUNTER — Telehealth: Payer: Self-pay

## 2018-09-11 DIAGNOSIS — N939 Abnormal uterine and vaginal bleeding, unspecified: Secondary | ICD-10-CM

## 2018-09-11 NOTE — Telephone Encounter (Addendum)
-----   Message from Catalina Antigua, MD sent at 09/11/2018 11:09 AM EST ----- Please inform patient of normal ultrasound with a small uterus without fibroids, normal ovaries with ovarian cysts. We can discuss management of her DUB during her upcoming 2/5 appointment  Notified pt of results and that her and the provider will discuss her bleeding at her appt on 10/02/18.  Pt verbalized understanding with no further questions.

## 2018-10-02 ENCOUNTER — Ambulatory Visit: Payer: Self-pay | Admitting: Obstetrics and Gynecology

## 2018-10-02 DIAGNOSIS — R87612 Low grade squamous intraepithelial lesion on cytologic smear of cervix (LGSIL): Secondary | ICD-10-CM | POA: Insufficient documentation

## 2018-10-02 NOTE — Progress Notes (Signed)
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 [facility] without insurance [facility] Patient will be rescheduled for colposcopy.

## 2018-10-17 ENCOUNTER — Telehealth (HOSPITAL_COMMUNITY): Payer: Self-pay

## 2018-10-17 NOTE — Telephone Encounter (Signed)
Tried to call patient but received call could not be completed as dialed. Will try and call back tomorrow to schedule with [facility].

## 2018-10-18 ENCOUNTER — Telehealth (HOSPITAL_COMMUNITY): Payer: Self-pay

## 2018-10-18 NOTE — Telephone Encounter (Signed)
Tried to call patient again to schedule appointment with [facility]. No voicemail box set up.

## 2018-10-21 ENCOUNTER — Telehealth (HOSPITAL_COMMUNITY): Payer: Self-pay

## 2018-10-21 NOTE — Telephone Encounter (Signed)
Patient does not have voicemail set up

## 2018-11-20 ENCOUNTER — Telehealth (HOSPITAL_COMMUNITY): Payer: Self-pay | Admitting: *Deleted

## 2018-11-20 NOTE — Telephone Encounter (Signed)
Telephoned patient at home number and confirmed appointment for Thursday March 26. Screened patient for COVID-19 no symptoms and no travel.  

## 2018-11-21 ENCOUNTER — Encounter (HOSPITAL_COMMUNITY): Payer: Self-pay

## 2018-11-21 ENCOUNTER — Ambulatory Visit (HOSPITAL_COMMUNITY)
Admission: RE | Admit: 2018-11-21 | Discharge: 2018-11-21 | Disposition: A | Discharge status: Home / Self Care | Payer: Self-pay | Source: Ambulatory Visit | Attending: Obstetrics and Gynecology | Admitting: Obstetrics and Gynecology

## 2018-11-21 ENCOUNTER — Other Ambulatory Visit: Payer: Self-pay

## 2018-11-21 ENCOUNTER — Other Ambulatory Visit (HOSPITAL_COMMUNITY): Payer: Self-pay | Admitting: *Deleted

## 2018-11-21 VITALS — BP 118/76 | Temp 98.6°F | Wt 151.0 lb

## 2018-11-21 DIAGNOSIS — N6320 Unspecified lump in the left breast, unspecified quadrant: Secondary | ICD-10-CM

## 2018-11-21 DIAGNOSIS — R87612 Low grade squamous intraepithelial lesion on cytologic smear of cervix (LGSIL): Secondary | ICD-10-CM

## 2018-11-21 DIAGNOSIS — N6315 Unspecified lump in the right breast, overlapping quadrants: Secondary | ICD-10-CM

## 2018-11-21 DIAGNOSIS — Z1239 Encounter for other screening for malignant neoplasm of breast: Secondary | ICD-10-CM

## 2018-11-21 DIAGNOSIS — N6310 Unspecified lump in the right breast, unspecified quadrant: Secondary | ICD-10-CM

## 2018-11-21 DIAGNOSIS — N63 Unspecified lump in unspecified breast: Secondary | ICD-10-CM

## 2018-11-21 DIAGNOSIS — N6325 Unspecified lump in the left breast, overlapping quadrants: Secondary | ICD-10-CM

## 2018-11-21 NOTE — Patient Instructions (Addendum)
Explained breast self awareness with Satira Sark. Patient did not need a Pap smear today due to last Pap smear was 09/05/2018. Explained the colposcopy the recommended follow-up for her abnormal Pap smear. [facility] to [facility] for a colposcopy to follow-up for her abnormal Pap smear. Appointment scheduled for Wednesday, December 18, 2018 at 1355. Referred patient to the [facility] for a bilateral breast ultrasound. Appointment scheduled for Tuesday, November 26, 2018 at 0730. Patient aware of appointments and will be there.  Julia Price verbalized understanding.  Brannock, Kathaleen Maser, RN 8:29 AM

## 2018-11-21 NOTE — Progress Notes (Signed)
Patient referred to [facility] by the [facility] due to having an abnormal Pap smear on 09/05/2018 that a colposcopy is recommended for follow-up.  Pap Smear: Pap smear not completed today. Last Pap smear was 09/05/2018 at the [facility] and [facility]. Referred patient to the [facility] for a colposcopy to follow-up for her abnormal Pap smear. Appointment scheduled for Wednesday, December 18, 2018 at 1355. Per patient has no history of an abnormal Pap smear prior to her most recent Pap smear. Last Pap smear result is in Epic.  Physical exam: Breasts Breasts symmetrical. No skin abnormalities bilateral breasts. Slight bilateral nipple retraction that per patient is normal for her. No nipple discharge bilateral breasts. No lymphadenopathy. Palpated a pea sized lump within the right breast at 12 o'clock 8 cm from the nipple. Palpated a bb sized lump within the left breast at 9 o'clock 6 cm from the breast. Complaints of bilateral inner breast tenderness on exam. Referred patient to the [facility] for a bilateral breast ultrasound. Appointment scheduled for Tuesday, November 26, 2018 at 0730.        Pelvic/Bimanual No Pap smear completed today since last Pap smear was 09/05/2018. Pap smear not indicated per [facility] guidelines.   Smoking History: Patient has never smoked.  Patient Navigation: Patient education provided. Access to services provided for patient through [facility] program.   Breast and Cervical Cancer Risk Assessment: Patient has no family history of breast cancer, known genetic mutations, or radiation treatment to the chest before age 58. Patient has no history of cervical dysplasia, immunocompromised, or DES exposure in-utero. Breast Cancer risk assessment completed. No breast cancer risk calculated due to patient is less than 82 years old.

## 2018-11-21 NOTE — Addendum Note (Signed)
Encounter addended by: Priscille Heidelberg, RN on: 11/21/2018 10:06 AM  Actions taken: Clinical Note Signed

## 2018-11-26 ENCOUNTER — Other Ambulatory Visit: Payer: Self-pay

## 2018-11-26 ENCOUNTER — Ambulatory Visit
Admission: RE | Admit: 2018-11-26 | Discharge: 2018-11-26 | Disposition: A | Discharge status: Home / Self Care | Payer: No Typology Code available for payment source | Source: Ambulatory Visit | Attending: Obstetrics and Gynecology | Admitting: Obstetrics and Gynecology

## 2018-11-26 ENCOUNTER — Other Ambulatory Visit (HOSPITAL_COMMUNITY): Payer: Self-pay | Admitting: Obstetrics and Gynecology

## 2018-11-26 DIAGNOSIS — N6320 Unspecified lump in the left breast, unspecified quadrant: Secondary | ICD-10-CM

## 2018-11-26 DIAGNOSIS — N63 Unspecified lump in unspecified breast: Secondary | ICD-10-CM

## 2018-12-10 ENCOUNTER — Telehealth: Payer: Self-pay | Admitting: Obstetrics and Gynecology

## 2018-12-10 NOTE — Telephone Encounter (Signed)
Patient was called about the appointment being changed. However, I was not able to leave a VM because it was not set up yet.

## 2018-12-18 ENCOUNTER — Ambulatory Visit: Payer: Self-pay | Admitting: Obstetrics & Gynecology

## 2018-12-18 ENCOUNTER — Telehealth: Payer: Self-pay

## 2018-12-18 NOTE — Telephone Encounter (Signed)
Pt called requesting to see if she is continuing to have her appt at 1355.  Called pt and let her know that her appt has been postponed due to COVID19 and someone from our front office will call with a new appt.  Pt stated that she figured that was going to happen and said thank you.

## 2019-01-06 ENCOUNTER — Encounter (HOSPITAL_COMMUNITY): Payer: Self-pay | Admitting: *Deleted

## 2019-01-21 ENCOUNTER — Other Ambulatory Visit: Payer: Self-pay

## 2019-01-21 ENCOUNTER — Other Ambulatory Visit (HOSPITAL_COMMUNITY)
Admission: RE | Admit: 2019-01-21 | Discharge: 2019-01-21 | Disposition: A | Discharge status: Home / Self Care | Payer: Self-pay | Source: Ambulatory Visit | Attending: Obstetrics & Gynecology | Admitting: Obstetrics & Gynecology

## 2019-01-21 ENCOUNTER — Encounter: Payer: Self-pay | Admitting: Obstetrics & Gynecology

## 2019-01-21 ENCOUNTER — Ambulatory Visit (INDEPENDENT_AMBULATORY_CARE_PROVIDER_SITE_OTHER): Payer: Self-pay | Admitting: Obstetrics & Gynecology

## 2019-01-21 VITALS — BP 112/69 | HR 105 | Temp 98.0°F | Wt 217.0 lb

## 2019-01-21 DIAGNOSIS — R87612 Low grade squamous intraepithelial lesion on cytologic smear of cervix (LGSIL): Secondary | ICD-10-CM

## 2019-01-21 DIAGNOSIS — N871 Moderate cervical dysplasia: Secondary | ICD-10-CM

## 2019-01-21 LAB — POCT PREGNANCY, URINE: Preg Test, Ur: NEGATIVE

## 2019-01-21 NOTE — Progress Notes (Signed)
   Subjective:    Patient ID: Julia Price, female    DOB: 1991-01-17, 28 y.o.   MRN: [id number]  HPI 28 yo single P1 here for a colpo due to LGSIL pap. She has been seeing Dr. Jolayne Panther for DUB. Her u/s in January was normal.   Review of Systems Same sex relationship Normal gyn u/s 1/20   She believes that she had Gardasil when she was younger Objective:   Physical Exam Breathing, conversing, and ambulating normally Well nourished, well hydrated Black female, no apparent distress UPT negative, consent signed, time out done Cervix prepped with acetic acid. Transformation zone seen in its entirety. Colpo adequate. 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  Silver nitrate was used to achieve hemostasis. ECC obtained. She tolerated the procedure well.      Assessment & Plan:  LGSIL pap- c/w colpo. Await pathology DUB- I have rec'd that she follow up with Dr. Jolayne Panther as I have no OR time until August.

## 2019-01-31 ENCOUNTER — Telehealth: Payer: Self-pay | Admitting: Obstetrics & Gynecology

## 2019-02-03 NOTE — Telephone Encounter (Signed)
I spoke to her and told her that she will need a LEEP.

## 2019-02-19 ENCOUNTER — Telehealth: Payer: Self-pay | Admitting: Obstetrics and Gynecology

## 2019-02-25 ENCOUNTER — Telehealth (HOSPITAL_COMMUNITY): Payer: Self-pay | Admitting: *Deleted

## 2019-02-25 NOTE — Telephone Encounter (Signed)
Called patient and explained the LEEP procedure would not be covered by [facility]. Told patient she can complete the financial assistance application for that visit. Patient stated she has access to the Internet. Explained to patient she can get the application on [email or web address] and take to her appointment at the [facility]. Patient verbalized understanding.

## 2019-02-25 NOTE — Telephone Encounter (Signed)
Attempted to call patient to let patient know that the LEEP that is recommended for follow-up for her colposcopy is not covered by [facility]. No one answered the phone and patients voicemail is not set up.

## 2019-03-05 ENCOUNTER — Telehealth: Payer: Self-pay | Admitting: *Deleted

## 2019-03-05 DIAGNOSIS — R87612 Low grade squamous intraepithelial lesion on cytologic smear of cervix (LGSIL): Secondary | ICD-10-CM

## 2019-03-05 NOTE — Telephone Encounter (Signed)
-----   Message from [name], MD sent at 03/05/2019 10:31 AM EDT ----- I tried to call her but she does not have VM set up yet. I tried to message her but she does not have that either. Can you please try to let her know that I discussed her case with a gyn oncologist, and she recommends no treatment at this time, recommends repeating pap in a year.  Thanks

## 2019-03-05 NOTE — Telephone Encounter (Signed)
Called pt to inform her of Dr. Alease Medina recommendation.  Pt verbalized understanding.  Pt states that she wanted to talk with the provider regarding "burning the lining of her uterus."  Advised pt that I will message the provider regarding this and see if we can change her LEEP appointment to a surgical consult.  Pt verbalized understanding and stated that she would like to be contacted with phone call and mychart message with provider's response.

## 2019-03-07 ENCOUNTER — Telehealth: Payer: Self-pay | Admitting: Obstetrics and Gynecology

## 2019-03-07 NOTE — Telephone Encounter (Signed)
signed into the virtual visit 15 minutes prior to time. The patient verbalized understanding. Called the patient to confirm the mychart visit scheduled for tomorrow. Informed the patient of being signed into the virtual visit 15 minutes prior to time. Informed the patient of how to download the mychart app and ensured the patient could log in and access the appointment. Patient verbalized understanding.

## 2019-03-10 ENCOUNTER — Telehealth (INDEPENDENT_AMBULATORY_CARE_PROVIDER_SITE_OTHER): Payer: Self-pay | Admitting: Obstetrics & Gynecology

## 2019-03-10 DIAGNOSIS — N939 Abnormal uterine and vaginal bleeding, unspecified: Secondary | ICD-10-CM

## 2019-03-10 DIAGNOSIS — R87612 Low grade squamous intraepithelial lesion on cytologic smear of cervix (LGSIL): Secondary | ICD-10-CM

## 2019-03-10 NOTE — Progress Notes (Signed)
    TELEHEALTH GYNECOLOGY VIRTUAL VIDEO VISIT ENCOUNTER NOTE  Provider location: [facility] at [facility]   I connected with Julia Price on 03/10/19 at  8:15 AM EDT by MyChart Video Encounter at home and verified that I am speaking with the correct person using two identifiers.   I discussed the limitations, risks, security and privacy concerns of performing an evaluation and management service virtually and the availability of in person appointments. I also discussed with the patient that there may be a patient responsible charge related to this service. The patient expressed understanding and agreed to proceed.   History:  Julia Price is a 28 y.o. G33P1001 (17 yo daughter)  female being evaluated today for a pre op exam. She has had bleeding q 2 weeks "my whole life". Her u/s was normal. Her hbg was 12.8. She has tried megace and she bled the whole time. She also felt "crazy" while on that. She does not want OCPs or IUD. She wants to try the ablation.     Past Medical History:  Diagnosis Date  . Arthritis   . Intractable cyclical vomiting    Past Surgical History:  Procedure Laterality Date  . NO PAST SURGERIES     The following portions of the patient's history were reviewed and updated as appropriate: allergies, current medications, past family history, past medical history, past social history, past surgical history and problem list.     Review of Systems:  Pertinent items noted in HPI and remainder of comprehensive ROS otherwise negative. She is in a same sex relationship, monogamous for 8 months. She has only had sex with a man 11 years ago. She works at [employer], cleaning, swing shift. Her child was delivered vaginally, she to be transfused postpartum.  Physical Exam:   General:  Alert, oriented and cooperative. Patient appears to be in no acute distress.  Mental Status: Normal mood and affect. Normal behavior. Normal judgment and thought content.    Respiratory: Normal respiratory effort, no problems with respiration noted  Rest of physical exam deferred due to type of encounter  Labs and Imaging No results found for this or any previous visit (from the past 336 hour(s)). No results found.     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 Try to do ablation, but she understands that the size of her uterus may prevent this. She wants to have a hysterectomy if the procedure cannot be done.      She understands the risks of surgery, including, but not to infection, bleeding, DVTs, damage to bowel, bladder, ureters. She wishes to proceed.  She will apply for financial aid through [facility].    I discussed the assessment and treatment plan with the patient. The patient was provided an opportunity to ask questions and all were answered. The patient agreed with the plan and demonstrated an understanding of the instructions.   The patient was advised to call back or seek an in-person evaluation/go to the ED if the symptoms worsen or if the condition fails to improve as anticipated.  I provided 15 minutes of face-to-face time during this encounter.   [name], MD [facility], [place]

## 2019-03-11 ENCOUNTER — Telehealth: Payer: Self-pay | Admitting: General Practice

## 2019-03-11 NOTE — Telephone Encounter (Signed)
-----   Message from Francia Greaves sent at 03/10/2019  2:47 PM EDT ----- Regarding: Needs Hysterectomy Statement Surgery 09/08

## 2019-03-11 NOTE — Telephone Encounter (Signed)
Called patient regarding upcoming surgery and confirmed she is scheduled for a D&C/hysteroscopy. Patient asked if she needed to complete financial paperwork. Told patient yes she will need to come by and pick up an application before her surgery. Patient verbalized understanding & had no questions

## 2019-03-19 ENCOUNTER — Telehealth: Payer: Self-pay | Admitting: General Practice

## 2019-03-19 NOTE — Telephone Encounter (Signed)
Patient is having D&C on 9/8 with potential hysterectomy and needs to sign Hysterectomy Statement Paper. Called patient, no answer- unable to leave message as voicemail is not setup. Will send mychart message.

## 2019-03-20 ENCOUNTER — Telehealth: Payer: Self-pay | Admitting: General Practice

## 2019-03-20 NOTE — Telephone Encounter (Signed)
Called patient regarding mychart message. She states she wants to know why Dr Hulan Fray would do the ablation and it possibly not even work and she would just have to have surgery again. Told patient per Dr Alease Medina note, she is going to try to do the ablation, but if she gets in there and her uterus is too big she will then do a hysterectomy at that time- not later on. Patient verbalized understanding and asked why she wouldn't do a hysterectomy from the start. Told patient the hysterectomy is a major surgery and the recovery is a lot longer compared to the ablation where she is in and out and back on her feet within a couple days. Patient verbalized understanding & had no questions.

## 2019-03-26 ENCOUNTER — Telehealth: Payer: Self-pay | Admitting: Lactation Services

## 2019-03-26 DIAGNOSIS — B977 Papillomavirus as the cause of diseases classified elsewhere: Secondary | ICD-10-CM

## 2019-03-26 NOTE — Telephone Encounter (Signed)
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 Discussed she needs to follow up in a year for a pap. Informed pt that is a common infection and that some types can turn into cancer hence the reason to follow up yearly.   Pt reports she is very upset and has informed her previous and current partner. Pt reports she feels like she is freaking out. Enc pt to take a deep breath and to take care of her health moving forward.   Pt asked if there was a medication to treat it, discussed no unless there are lesions that can be seen and need to be treated. Pt voiced understanding. Pt to call with further questions/concerns as needed.

## 2019-04-01 NOTE — Progress Notes (Unsigned)
Pt signed Hysterectomy Statement-04/01/2019

## 2019-04-07 ENCOUNTER — Encounter: Payer: Self-pay | Admitting: *Deleted

## 2019-04-24 NOTE — Pre-Procedure Instructions (Signed)
[facility] [address], [address] [place] [address] Phone: [phone] Fax: [phone]  [place], [place] [address] Phone: [phone] Fax: [phone]      Your procedure is scheduled on  05-06-19  Report to [facility] Main Entrance "A" at 1030 A.M., and check in at the Admitting office.  Call this number if you have problems the morning of surgery:  [phone]  Call [phone] if you have any questions prior to your surgery date Monday-Friday 8am-4pm    Remember:  Do not eat or drink after midnight the night before your surgery  You may drink clear liquids until 0930 am the morning of your surgery.   Clear liquids allowed are: Water, Non-Citrus Juices (without pulp), Carbonated Beverages, Clear Tea, Black Coffee Only, and Gatorade  Please complete your PRE-SURGERY ENSURE that was provided to you by 0930 am the morning of surgery.  Please, if able, drink it in one setting. DO NOT SIP.   Take these medicines the morning of surgery with A SIP OF WATER :none   7 days prior to surgery STOP taking any Aspirin (unless otherwise instructed by your surgeon), Aleve, Naproxen, Ibuprofen, Motrin, Advil, Goody's, BC's, all herbal medications, fish oil, and all vitamins.    The Morning of Surgery  Do not wear jewelry, make-up or nail polish.  Do not wear lotions, powders, or perfumes, or deodorant  Do not shave 48 hours prior to surgery.    Do not bring valuables to the hospital.  [facility] is not responsible for any belongings or valuables.  If you are a smoker, DO NOT Smoke 24 hours prior to surgery IF you wear a CPAP at night please bring your mask, tubing, and machine the morning of surgery   Remember that you must have someone to transport you home after your surgery, and remain with you for  24 hours if you are discharged the same day.   Contacts, glasses, hearing aids, dentures or bridgework may not be worn into surgery.    Leave your suitcase in the car.  After surgery it may be brought to your room.  For patients admitted to the hospital, discharge time will be determined by your treatment team.  Patients discharged the day of surgery will not be allowed to drive home.    Special instructions:   [facility]- Preparing For Surgery  Before surgery, you can play an important role. Because skin is not sterile, your skin needs to be as free of germs as possible. You can reduce the number of germs on your skin by washing with CHG (chlorahexidine gluconate) Soap before surgery.  CHG is an antiseptic cleaner which kills germs and bonds with the skin to continue killing germs even after washing.    Oral Hygiene is also important to reduce your risk of infection.  Remember - BRUSH YOUR TEETH THE MORNING OF SURGERY WITH YOUR REGULAR TOOTHPASTE  Please do not use if you have an allergy to CHG or antibacterial soaps. If your skin becomes reddened/irritated stop using the CHG.  Do not shave (including legs and underarms) for at least 48 hours prior to first CHG shower. It is OK to shave your face.  Please follow these instructions carefully.   1. Shower the NIGHT BEFORE SURGERY and the MORNING OF  SURGERY with CHG Soap.   2. If you chose to wash your hair, wash your hair first as usual with your normal shampoo.  3. After you shampoo, rinse your hair and body thoroughly to remove the shampoo.  4. Use CHG as you would any other liquid soap. You can apply CHG directly to the skin and wash gently with a scrungie or a clean washcloth.   5. Apply the CHG Soap to your body ONLY FROM THE NECK DOWN.  Do not use on open wounds or open sores. Avoid contact with your eyes, ears, mouth and genitals (private parts). Wash Face and genitals (private parts)  with your normal soap.   6. Wash  thoroughly, paying special attention to the area where your surgery will be performed.  7. Thoroughly rinse your body with warm water from the neck down.  8. DO NOT shower/wash with your normal soap after using and rinsing off the CHG Soap.  9. Pat yourself dry with a CLEAN TOWEL.  10. Wear CLEAN PAJAMAS to bed the night before surgery, wear comfortable clothes the morning of surgery  11. Place CLEAN SHEETS on your bed the night of your first shower and DO NOT SLEEP WITH PETS.  Day of Surgery:  Do not apply any deodorants/lotions. Please shower the morning of surgery with the CHG soap  Please wear clean clothes to the hospital/surgery center.   Remember to brush your teeth WITH YOUR REGULAR TOOTHPASTE.  Please read over the  fact sheets that you were given.

## 2019-04-25 ENCOUNTER — Encounter (HOSPITAL_COMMUNITY)
Admission: RE | Admit: 2019-04-25 | Discharge: 2019-04-25 | Disposition: A | Discharge status: Home / Self Care | Payer: Self-pay | Source: Ambulatory Visit | Attending: Obstetrics & Gynecology | Admitting: Obstetrics & Gynecology

## 2019-04-25 ENCOUNTER — Other Ambulatory Visit: Payer: Self-pay

## 2019-04-25 ENCOUNTER — Encounter (HOSPITAL_COMMUNITY): Payer: Self-pay

## 2019-04-25 DIAGNOSIS — Z01812 Encounter for preprocedural laboratory examination: Secondary | ICD-10-CM | POA: Insufficient documentation

## 2019-04-25 LAB — CBC
HCT: 39.8 % (ref 36.0–46.0)
Hemoglobin: 13.1 g/dL (ref 12.0–15.0)
MCH: 28.6 pg (ref 26.0–34.0)
MCHC: 32.9 g/dL (ref 30.0–36.0)
MCV: 86.9 fL (ref 80.0–100.0)
Platelets: 282 10*3/uL (ref 150–400)
RBC: 4.58 MIL/uL (ref 3.87–5.11)
RDW: 13.9 % (ref 11.5–15.5)
WBC: 4.6 10*3/uL (ref 4.0–10.5)
nRBC: 0 % (ref 0.0–0.2)

## 2019-04-25 LAB — BASIC METABOLIC PANEL
Anion gap: 8 (ref 5–15)
BUN: 6 mg/dL (ref 6–20)
CO2: 24 mmol/L (ref 22–32)
Calcium: 9 mg/dL (ref 8.9–10.3)
Chloride: 106 mmol/L (ref 98–111)
Creatinine, Ser: 0.71 mg/dL (ref 0.44–1.00)
GFR calc Af Amer: >60 mL/min (ref >60)
GFR calc non Af Amer: >60 mL/min (ref >60)
Glucose, Bld: 99 mg/dL (ref 70–99)
Potassium: 3.8 mmol/L (ref 3.5–5.1)
Sodium: 138 mmol/L (ref 135–145)

## 2019-04-25 LAB — TYPE AND SCREEN
ABO/RH(D): O POS
Antibody Screen: NEGATIVE

## 2019-04-25 LAB — ABO/RH: ABO/RH(D): O POS

## 2019-04-25 NOTE — Progress Notes (Signed)
  Coronavirus Screening Scheduled for covid test on 05/02/19 Have you experienced the following symptoms:  Cough yes/no: No Fever (>100.32F)  yes/no: No Runny nose yes/no: No Sore throat yes/no: No Difficulty breathing/shortness of breath  yes/no: No Loss of smell or taste Have you or a family member traveled in the last 14 days and where? yes/no: No  PCP - [facility]  Cardiologist - denies  Chest x-ray - denies  EKG - denies  Stress Test - denies  ECHO - denies  Cardiac Cath - denies  AICD-denies PM-denies LOOP-denies  Sleep Study -denies CPAP - denies  LABS-CBC,BMP,T/S  ASA-denies  ERAS-Pre-surgery Ensure given with instructions  HA1C-denies Fasting Blood Sugar -  Checks Blood Sugar __0___ times a day  Anesthesia-N  Pt denies having chest pain, sob, or fever at this time. All instructions explained to the pt, with a verbal understanding of the material. Pt agrees to go over the instructions while at home for a better understanding. Pt also instructed to self quarantine after being tested for COVID-19. The opportunity to ask questions was provided.

## 2019-05-02 ENCOUNTER — Other Ambulatory Visit (HOSPITAL_COMMUNITY)
Admission: RE | Admit: 2019-05-02 | Discharge: 2019-05-02 | Disposition: A | Discharge status: Home / Self Care | Payer: HRSA Program | Source: Ambulatory Visit | Attending: Obstetrics & Gynecology | Admitting: Obstetrics & Gynecology

## 2019-05-02 DIAGNOSIS — Z20828 Contact with and (suspected) exposure to other viral communicable diseases: Secondary | ICD-10-CM | POA: Insufficient documentation

## 2019-05-02 DIAGNOSIS — Z01812 Encounter for preprocedural laboratory examination: Secondary | ICD-10-CM | POA: Diagnosis present

## 2019-05-03 LAB — NOVEL CORONAVIRUS, NAA (HOSP ORDER, SEND-OUT TO REF LAB; TAT 18-24 HRS): SARS-CoV-2, NAA: NOT DETECTED

## 2019-05-06 ENCOUNTER — Encounter (HOSPITAL_COMMUNITY)
Admission: RE | Disposition: A | Discharge status: Home / Self Care | Payer: Self-pay | Source: Home / Self Care | Attending: Obstetrics & Gynecology

## 2019-05-06 ENCOUNTER — Observation Stay (HOSPITAL_COMMUNITY): Payer: Self-pay | Admitting: Anesthesiology

## 2019-05-06 ENCOUNTER — Encounter (HOSPITAL_COMMUNITY): Payer: Self-pay | Admitting: Orthopedic Surgery

## 2019-05-06 ENCOUNTER — Observation Stay (HOSPITAL_COMMUNITY)
Admission: RE | Admit: 2019-05-06 | Discharge: 2019-05-07 | Disposition: A | Discharge status: Home / Self Care | Payer: Self-pay | Attending: Obstetrics & Gynecology | Admitting: Obstetrics & Gynecology

## 2019-05-06 ENCOUNTER — Observation Stay (HOSPITAL_COMMUNITY): Payer: Self-pay | Admitting: Vascular Surgery

## 2019-05-06 DIAGNOSIS — F17210 Nicotine dependence, cigarettes, uncomplicated: Secondary | ICD-10-CM | POA: Insufficient documentation

## 2019-05-06 DIAGNOSIS — N938 Other specified abnormal uterine and vaginal bleeding: Principal | ICD-10-CM | POA: Insufficient documentation

## 2019-05-06 DIAGNOSIS — M1990 Unspecified osteoarthritis, unspecified site: Secondary | ICD-10-CM | POA: Insufficient documentation

## 2019-05-06 DIAGNOSIS — Z9889 Other specified postprocedural states: Secondary | ICD-10-CM

## 2019-05-06 DIAGNOSIS — N939 Abnormal uterine and vaginal bleeding, unspecified: Secondary | ICD-10-CM

## 2019-05-06 HISTORY — PX: VAGINAL HYSTERECTOMY: SHX2639

## 2019-05-06 HISTORY — DX: Unspecified abnormal cytological findings in specimens from vagina: R87.629

## 2019-05-06 LAB — POCT PREGNANCY, URINE: Preg Test, Ur: NEGATIVE

## 2019-05-06 SURGERY — HYSTERECTOMY, VAGINAL
Anesthesia: General | Site: Vagina | Laterality: Bilateral

## 2019-05-06 MED ORDER — IBUPROFEN 800 MG PO TABS
800.0000 mg | ORAL_TABLET | Freq: Three times a day (TID) | ORAL | Status: DC
  Administered 2019-05-06 – 2019-05-07 (×3): 800 mg via ORAL
  Filled 2019-05-06 (×3): qty 1

## 2019-05-06 MED ORDER — ONDANSETRON HCL 4 MG/2ML IJ SOLN
INTRAMUSCULAR | Status: DC | PRN
  Administered 2019-05-06: 4 mg via INTRAVENOUS

## 2019-05-06 MED ORDER — ONDANSETRON HCL 4 MG/2ML IJ SOLN
4.0000 mg | Freq: Four times a day (QID) | INTRAMUSCULAR | Status: DC | PRN
  Administered 2019-05-06: 20:00:00 4 mg via INTRAVENOUS
  Filled 2019-05-06: qty 2

## 2019-05-06 MED ORDER — PROMETHAZINE HCL 25 MG/ML IJ SOLN
6.2500 mg | INTRAMUSCULAR | Status: DC | PRN
  Administered 2019-05-06: 6.25 mg via INTRAVENOUS

## 2019-05-06 MED ORDER — DEXAMETHASONE SODIUM PHOSPHATE 10 MG/ML IJ SOLN
INTRAMUSCULAR | Status: AC
  Filled 2019-05-06: qty 1

## 2019-05-06 MED ORDER — PROPOFOL 10 MG/ML IV BOLUS
INTRAVENOUS | Status: DC | PRN
  Administered 2019-05-06: 150 mg via INTRAVENOUS

## 2019-05-06 MED ORDER — HYDROMORPHONE HCL 1 MG/ML IJ SOLN
INTRAMUSCULAR | Status: AC
  Administered 2019-05-06: 0.5 mg via INTRAVENOUS
  Filled 2019-05-06: qty 1

## 2019-05-06 MED ORDER — DEXAMETHASONE SODIUM PHOSPHATE 10 MG/ML IJ SOLN
INTRAMUSCULAR | Status: DC | PRN
  Administered 2019-05-06: 10 mg via INTRAVENOUS

## 2019-05-06 MED ORDER — FENTANYL CITRATE (PF) 100 MCG/2ML IJ SOLN
INTRAMUSCULAR | Status: DC | PRN
  Administered 2019-05-06: 50 ug via INTRAVENOUS
  Administered 2019-05-06: 100 ug via INTRAVENOUS
  Administered 2019-05-06: 50 ug via INTRAVENOUS

## 2019-05-06 MED ORDER — LIDOCAINE 2% (20 MG/ML) 5 ML SYRINGE
INTRAMUSCULAR | Status: AC
  Filled 2019-05-06: qty 5

## 2019-05-06 MED ORDER — PROMETHAZINE HCL 25 MG/ML IJ SOLN
INTRAMUSCULAR | Status: AC
  Filled 2019-05-06: qty 1

## 2019-05-06 MED ORDER — OXYCODONE HCL 5 MG/5ML PO SOLN: 5.0000 mg | Freq: Once | ORAL | Status: DC | PRN

## 2019-05-06 MED ORDER — OXYCODONE HCL 5 MG PO TABS: 5.0000 mg | ORAL_TABLET | Freq: Once | ORAL | Status: DC | PRN

## 2019-05-06 MED ORDER — BUPIVACAINE-EPINEPHRINE (PF) 0.5% -1:200000 IJ SOLN
INTRAMUSCULAR | Status: AC
  Filled 2019-05-06: qty 30

## 2019-05-06 MED ORDER — BUPIVACAINE-EPINEPHRINE 0.5% -1:200000 IJ SOLN
INTRAMUSCULAR | Status: DC | PRN
  Administered 2019-05-06: 30 mL

## 2019-05-06 MED ORDER — ACETAMINOPHEN 500 MG PO TABS
1000.0000 mg | ORAL_TABLET | Freq: Four times a day (QID) | ORAL | Status: DC
  Administered 2019-05-06 – 2019-05-07 (×4): 1000 mg via ORAL
  Filled 2019-05-06 (×4): qty 2

## 2019-05-06 MED ORDER — LACTATED RINGERS IV SOLN
INTRAVENOUS | Status: DC
  Administered 2019-05-06: 09:00:00 via INTRAVENOUS

## 2019-05-06 MED ORDER — ACETAMINOPHEN 500 MG PO TABS
1000.0000 mg | ORAL_TABLET | Freq: Once | ORAL | Status: AC
  Administered 2019-05-06: 09:00:00 1000 mg via ORAL
  Filled 2019-05-06: qty 2

## 2019-05-06 MED ORDER — MIDAZOLAM HCL 5 MG/5ML IJ SOLN
INTRAMUSCULAR | Status: DC | PRN
  Administered 2019-05-06: 2 mg via INTRAVENOUS

## 2019-05-06 MED ORDER — KETOROLAC TROMETHAMINE 30 MG/ML IJ SOLN
INTRAMUSCULAR | Status: AC
  Administered 2019-05-06: 30 mg via INTRAVENOUS
  Filled 2019-05-06: qty 1

## 2019-05-06 MED ORDER — LIDOCAINE 2% (20 MG/ML) 5 ML SYRINGE
INTRAMUSCULAR | Status: DC | PRN
  Administered 2019-05-06: 65 mg via INTRAVENOUS

## 2019-05-06 MED ORDER — KETOROLAC TROMETHAMINE 30 MG/ML IJ SOLN
30.0000 mg | Freq: Once | INTRAMUSCULAR | Status: AC
  Administered 2019-05-06: 12:00:00 30 mg via INTRAVENOUS

## 2019-05-06 MED ORDER — MEPERIDINE HCL 25 MG/ML IJ SOLN: 6.2500 mg | INTRAMUSCULAR | Status: DC | PRN

## 2019-05-06 MED ORDER — OXYCODONE HCL 5 MG PO TABS
5.0000 mg | ORAL_TABLET | ORAL | Status: DC | PRN
  Administered 2019-05-06 – 2019-05-07 (×4): 10 mg via ORAL
  Filled 2019-05-06 (×4): qty 2

## 2019-05-06 MED ORDER — ONDANSETRON HCL 4 MG PO TABS: 4.0000 mg | ORAL_TABLET | Freq: Four times a day (QID) | ORAL | Status: DC | PRN

## 2019-05-06 MED ORDER — ONDANSETRON HCL 4 MG/2ML IJ SOLN
INTRAMUSCULAR | Status: AC
  Filled 2019-05-06: qty 2

## 2019-05-06 MED ORDER — HYDROMORPHONE HCL 1 MG/ML IJ SOLN
1.0000 mg | INTRAMUSCULAR | Status: DC | PRN
  Administered 2019-05-06 – 2019-05-07 (×2): 1 mg via INTRAVENOUS
  Filled 2019-05-06 (×2): qty 1

## 2019-05-06 MED ORDER — LACTATED RINGERS IV SOLN
INTRAVENOUS | Status: DC
  Administered 2019-05-07: 06:00:00 via INTRAVENOUS

## 2019-05-06 MED ORDER — FENTANYL CITRATE (PF) 250 MCG/5ML IJ SOLN
INTRAMUSCULAR | Status: AC
  Filled 2019-05-06: qty 5

## 2019-05-06 MED ORDER — SODIUM CHLORIDE (PF) 0.9 % IJ SOLN
INTRAMUSCULAR | Status: AC
  Filled 2019-05-06: qty 100

## 2019-05-06 MED ORDER — SODIUM CHLORIDE (PF) 0.9 % IJ SOLN
INTRAMUSCULAR | Status: DC | PRN
  Administered 2019-05-06: 100 mL

## 2019-05-06 MED ORDER — PROPOFOL 10 MG/ML IV BOLUS
INTRAVENOUS | Status: AC
  Filled 2019-05-06: qty 20

## 2019-05-06 MED ORDER — ROCURONIUM BROMIDE 10 MG/ML (PF) SYRINGE
PREFILLED_SYRINGE | INTRAVENOUS | Status: DC | PRN
  Administered 2019-05-06: 30 mg via INTRAVENOUS

## 2019-05-06 MED ORDER — HYDROMORPHONE HCL 1 MG/ML IJ SOLN
0.2500 mg | INTRAMUSCULAR | Status: DC | PRN
  Administered 2019-05-06 (×3): 0.5 mg via INTRAVENOUS

## 2019-05-06 MED ORDER — CEFAZOLIN SODIUM-DEXTROSE 2-4 GM/100ML-% IV SOLN
2.0000 g | INTRAVENOUS | Status: AC
  Administered 2019-05-06: 11:00:00 2 g via INTRAVENOUS
  Filled 2019-05-06: qty 100

## 2019-05-06 MED ORDER — SCOPOLAMINE 1 MG/3DAYS TD PT72
1.0000 | MEDICATED_PATCH | TRANSDERMAL | Status: DC
  Administered 2019-05-06: 11:00:00 1 via TRANSDERMAL

## 2019-05-06 MED ORDER — MIDAZOLAM HCL 2 MG/2ML IJ SOLN
INTRAMUSCULAR | Status: AC
  Filled 2019-05-06: qty 2

## 2019-05-06 MED ORDER — SUGAMMADEX SODIUM 200 MG/2ML IV SOLN
INTRAVENOUS | Status: DC | PRN
  Administered 2019-05-06: 150 mg via INTRAVENOUS

## 2019-05-06 MED ORDER — SCOPOLAMINE 1 MG/3DAYS TD PT72
MEDICATED_PATCH | TRANSDERMAL | Status: AC
  Filled 2019-05-06: qty 1

## 2019-05-06 SURGICAL SUPPLY — 25 items
CANISTER SUCT 3000ML PPV (MISCELLANEOUS) ×3 IMPLANT
DECANTER SPIKE VIAL GLASS SM (MISCELLANEOUS) ×3 IMPLANT
GLOVE BIO SURGEON STRL SZ 6.5 (GLOVE) ×2 IMPLANT
GLOVE BIO SURGEONS STRL SZ 6.5 (GLOVE) ×1
GLOVE BIOGEL PI IND STRL 6.5 (GLOVE) ×1 IMPLANT
GLOVE BIOGEL PI IND STRL 7.0 (GLOVE) ×1 IMPLANT
GLOVE BIOGEL PI INDICATOR 6.5 (GLOVE) ×2
GLOVE BIOGEL PI INDICATOR 7.0 (GLOVE) ×2
GOWN STRL REUS W/ TWL LRG LVL3 (GOWN DISPOSABLE) ×4 IMPLANT
GOWN STRL REUS W/TWL LRG LVL3 (GOWN DISPOSABLE) ×12
KIT TURNOVER KIT B (KITS) ×3 IMPLANT
NDL MAYO CATGUT SZ4 TPR NDL (NEEDLE) ×1 IMPLANT
NDL SPNL 18GX3.5 QUINCKE PK (NEEDLE) ×1 IMPLANT
NEEDLE MAYO CATGUT SZ4 (NEEDLE) ×3 IMPLANT
NEEDLE SPNL 18GX3.5 QUINCKE PK (NEEDLE) ×3 IMPLANT
NS IRRIG 1000ML POUR BTL (IV SOLUTION) ×3 IMPLANT
PACK VAGINAL WOMENS (CUSTOM PROCEDURE TRAY) ×3 IMPLANT
PAD OB MATERNITY 4.3X12.25 (PERSONAL CARE ITEMS) ×3 IMPLANT
SPECIMEN JAR MEDIUM (MISCELLANEOUS) ×3 IMPLANT
SUT VIC AB 2-0 CT1 18 (SUTURE) ×6 IMPLANT
SUT VIC AB 2-0 CT1 27 (SUTURE) ×3
SUT VIC AB 2-0 CT1 TAPERPNT 27 (SUTURE) ×1 IMPLANT
TOWEL GREEN STERILE FF (TOWEL DISPOSABLE) ×6 IMPLANT
TRAY FOLEY W/BAG SLVR 14FR (SET/KITS/TRAYS/PACK) ×3 IMPLANT
UNDERPAD 30X30 (UNDERPADS AND DIAPERS) ×3 IMPLANT

## 2019-05-06 NOTE — Anesthesia Postprocedure Evaluation (Signed)
Anesthesia Post Note  Patient: Julia Price  Procedure(s) Performed: HYSTERECTOMY VAGINAL WITH SALPINGECTOMY (Bilateral Vagina )     Anesthesia Post Evaluation  Last Vitals:  Vitals:   05/06/19 1200 05/06/19 1215  BP: 117/71 112/67  Pulse: 67 63  Resp: 17 19  Temp:    SpO2: 100% 100%    Last Pain:  Vitals:   05/06/19 1225  TempSrc:   PainSc: 10-Worst pain ever                 Pervis Hocking

## 2019-05-06 NOTE — Op Note (Addendum)
05/06/2019  11:54 AM  PATIENT:  Julia Price  28 y.o. female  PRE-OPERATIVE DIAGNOSIS:  Abnormal uterine bleeding  POST-OPERATIVE DIAGNOSIS:  abnormal uterine bleeding  PROCEDURE:  Procedure(s): HYSTERECTOMY VAGINAL WITH SALPINGECTOMY (Bilateral)  SURGEON:  Surgeon(s) and Role:    * Damonique Brunelle, Wilhemina Cash, MD - Primary    * Donnamae Jude, MD - Assisting  ANESTHESIA: general and local  EBL:  100 mL   BLOOD ADMINISTERED:none  DRAINS: Urinary Catheter (Foley)   LOCAL MEDICATIONS USED:  MARCAINE     SPECIMEN:  Source of Specimen:  uterus and tubes  DISPOSITION OF SPECIMEN:  PATHOLOGY  COUNTS:  YES  TOURNIQUET:  * No tourniquets in log *  DICTATION: .Dragon Dictation  PLAN OF CARE: Admit for overnight observation  PATIENT DISPOSITION:  PACU - hemodynamically stable.   Delay start of Pharmacological VTE agent (>24hrs) due to surgical blood loss or risk of bleeding: not applicable  The risks, benefits, alternatives of surgery were explained, understood, and accepted. All questions were answered and a consent form was signed. She was taken to the operating room and general anesthesia was applied. She was put in the dorsal lithotomy position. Her vagina was prepped and draped in the usual sterile fashion. A timeout procedure was done. A bimanual exam revealed a small retroverted uterus and non-enlarged adnexa. A latex free catheter was placed and it drained clear urine throughout the case. The cervix was grasped with a single-tooth tenaculum. A total of 110 cc of dilute Marcaine was injected in a circumferential fashion at the cervicovaginal junction. An incision was made at the site. The posterior peritoneum was entered. A long weighted speculum was placed. The anterior peritoneum was entered. A Deaver was placed anteriorly. The uterosacral ligaments were clamped, cut, and ligated. They were tagged and held. 2 Vicryl sutures used throughout this case unless otherwise specified. The  small uterus was separated from its pelvic attachments using a similar clamp, cut, ligate technique. Excellent hemostasis was maintained throughout. Once the uterus was removed, the bowel was kept out of the operative site with a sponge on a stick. I was able to see both ovaries and they appeared normal. I grasped the fimbriated end of each oviduct and grasped them with a Kelley clamp. I excised both. Both pedicles were ligated with 2-0 vicryl suture. Excellent hemostasis was noted. The peritoneum was closed in a purse string fashion.  I used the tags on the uterosacral ligments to incorportate these into the vaginal angles for vaginal support.  I closed the edges of the vaginal mucosa in a running, locking fashion, closing the entire vaginal cuff in a horizontal fashion. She was extubated and taken to the recovery room in stable condition. An experienced assistant was required given the standard of surgical care given the complexity of the case.  This assistant was needed for exposure, dissection, suctioning, retraction, instrument exchange, assisting with delivery with administration of fundal pressure, and for overall help during the procedure.  ----- Message -----

## 2019-05-06 NOTE — Anesthesia Procedure Notes (Signed)
Procedure Name: Intubation Date/Time: 05/06/2019 10:38 AM Performed by: Jenne Campus, CRNA Pre-anesthesia Checklist: Patient identified, Emergency Drugs available, Suction available and Patient being monitored Patient Re-evaluated:Patient Re-evaluated prior to induction Oxygen Delivery Method: Circle System Utilized Preoxygenation: Pre-oxygenation with 100% oxygen Induction Type: IV induction Ventilation: Mask ventilation without difficulty Laryngoscope Size: Miller and 2 Grade View: Grade I Tube type: Oral Tube size: 7.0 mm Number of attempts: 1 Airway Equipment and Method: Stylet Placement Confirmation: ETT inserted through vocal cords under direct vision,  positive ETCO2 and breath sounds checked- equal and bilateral Secured at: 22 cm Tube secured with: Tape Dental Injury: Teeth and Oropharynx as per pre-operative assessment

## 2019-05-06 NOTE — Transfer of Care (Signed)
Immediate Anesthesia Transfer of Care Note  Patient: Julia Price  Procedure(s) Performed: HYSTERECTOMY VAGINAL WITH SALPINGECTOMY (Bilateral Vagina )  Patient Location: PACU  Anesthesia Type:General  Level of Consciousness: awake, oriented and patient cooperative  Airway & Oxygen Therapy: Patient Spontanous Breathing and Patient connected to nasal cannula oxygen  Post-op Assessment: Report given to RN and Post -op Vital signs reviewed and stable  Post vital signs: Reviewed  Last Vitals:  Vitals Value Taken Time  BP 124/87 05/06/19 1140  Temp    Pulse 90 05/06/19 1144  Resp 23 05/06/19 1144  SpO2 100 % 05/06/19 1144  Vitals shown include unvalidated device data.  Last Pain:  Vitals:   05/06/19 0920  TempSrc: Oral         Complications: No apparent anesthesia complications

## 2019-05-06 NOTE — Progress Notes (Signed)
Received patient from PACU. Patient alert and oriented x4. No complaints of pain at this time. Patient oriented to call bell and bed controls. Instructed patient to utilize call bell should she need anything. Will continue to monitor.

## 2019-05-06 NOTE — Anesthesia Preprocedure Evaluation (Addendum)
Anesthesia Evaluation  Patient identified by MRN, date of birth, ID band Patient awake    Reviewed: Allergy & Precautions, NPO status , Patient's Chart, lab work & pertinent test results  Airway Mallampati: II  TM Distance: >3 FB Neck ROM: Full    Dental no notable dental hx. (+) Teeth Intact, Dental Advisory Given   Pulmonary neg pulmonary ROS, Current Smoker,    Pulmonary exam normal breath sounds clear to auscultation       Cardiovascular negative cardio ROS Normal cardiovascular exam Rhythm:Regular Rate:Normal     Neuro/Psych negative neurological ROS  negative psych ROS   GI/Hepatic negative GI ROS, (+)     substance abuse  marijuana use,   Endo/Other  negative endocrine ROS  Renal/GU negative Renal ROS   Abnormal uterine bleeding    Musculoskeletal negative musculoskeletal ROS (+)   Abdominal   Peds negative pediatric ROS (+)  Hematology negative hematology ROS (+)   Anesthesia Other Findings   Reproductive/Obstetrics negative OB ROS                            Anesthesia Physical Anesthesia Plan  ASA: II  Anesthesia Plan: General   Post-op Pain Management:    Induction: Intravenous  PONV Risk Score and Plan: 3 and Ondansetron, Dexamethasone and Midazolam  Airway Management Planned: Oral ETT  Additional Equipment: None  Intra-op Plan:   Post-operative Plan: Extubation in OR  Informed Consent: I have reviewed the patients History and Physical, chart, labs and discussed the procedure including the risks, benefits and alternatives for the proposed anesthesia with the patient or authorized representative who has indicated his/her understanding and acceptance.     Dental advisory given  Plan Discussed with: CRNA  Anesthesia Plan Comments:         Anesthesia Quick Evaluation

## 2019-05-06 NOTE — H&P (Signed)
Julia Price is a single 28 y.o. G44P1001 (63 yo daughter)  female being evaluated today for vaginal hysterectomy and removal of her oviducts.  She has had bleeding q 2 weeks "my whole life". Her u/s was normal. Her hbg was 12.8. She has tried megace and she bled the whole time. She also felt "crazy" while on that. She does not want OCPs or IUD. She is certain that she does not want more kids. She also has a h/o LGSIL on pap, colposcopic directed biopsy and endocervical curettage recently.    Patient's last menstrual period was 04/18/2019.    Past Medical History:  Diagnosis Date  . Arthritis   . Intractable cyclical vomiting     Past Surgical History:  Procedure Laterality Date  . NO PAST SURGERIES      Family History  Problem Relation Age of Onset  . Diabetes Maternal Grandfather   . Hypertension Maternal Grandfather     Social History:  reports that she has been smoking cigarettes. She has been smoking about 1.00 pack per day. She has never used smokeless tobacco. She reports current alcohol use. She reports current drug use. Drug: Marijuana.  Allergies:  Allergies  Allergen Reactions  . Latex Hives and Rash    Medications Prior to Admission  Medication Sig Dispense Refill Last Dose  . ibuprofen (ADVIL,MOTRIN) 200 MG tablet Take 800-1,200 mg by mouth every 8 (eight) hours as needed for headache or moderate pain. For pain.    Past Month at Unknown time  . Multiple Vitamin (MULTIVITAMIN WITH MINERALS) TABS tablet Take 1 tablet by mouth daily.   Past Month at Unknown time  . Wild [place], Dioscorea villosa, (WILD YAM PO) Take 2 capsules by mouth daily.   Past Month at Unknown time  . megestrol (MEGACE) 40 MG tablet Take 1 tablet (40 mg total) by mouth daily. (Patient not taking: Reported on 11/21/2018) 30 tablet 3     ROS  She has been in an "off and on" same sex relationship with a "friend" for the last 11 years. She does not have heterosexual sex. She works swing shift  cleaning.  Blood pressure 120/63, pulse 70, temperature 97.8 F (36.6 C), temperature source Oral, resp. rate 18, height 5' 2.99" (1.6 m), weight 66.2 kg, last menstrual period 04/18/2019, SpO2 100 %. Physical Exam  Breathing, conversing, and ambulating normally Well nourished, well hydrated Black female, no apparent distress Heart- rrr Lungs- CTAB Abd - benign   Results for orders placed or performed during the hospital encounter of 05/06/19 (from the past 24 hour(s))  Pregnancy, urine POC     Status: None   Collection Time: 05/06/19  9:14 AM  Result Value Ref Range   Preg Test, Ur NEGATIVE NEGATIVE    No results found.  Assessment/Plan: DUB- her uterus is too tiny for an ablation. She declines other medical forms of treatment and wishes to proceed with a vaginal hysterectomy and removal of her tubes. She is certain that she does not want more kids.  She understands the risks of surgery, including, but not to infection, bleeding, DVTs, damage to bowel, bladder, ureters. She wishes to proceed.     Julia Price 05/06/2019, 10:25 AM

## 2019-05-07 ENCOUNTER — Encounter (HOSPITAL_COMMUNITY): Payer: Self-pay | Admitting: Obstetrics & Gynecology

## 2019-05-07 ENCOUNTER — Other Ambulatory Visit: Payer: Self-pay

## 2019-05-07 LAB — CBC
HCT: 34.6 % — ABNORMAL LOW (ref 36.0–46.0)
Hemoglobin: 11.8 g/dL — ABNORMAL LOW (ref 12.0–15.0)
MCH: 28.6 pg (ref 26.0–34.0)
MCHC: 34.1 g/dL (ref 30.0–36.0)
MCV: 84 fL (ref 80.0–100.0)
Platelets: 226 10*3/uL (ref 150–400)
RBC: 4.12 MIL/uL (ref 3.87–5.11)
RDW: 13.6 % (ref 11.5–15.5)
WBC: 14.2 10*3/uL — ABNORMAL HIGH (ref 4.0–10.5)
nRBC: 0 % (ref 0.0–0.2)

## 2019-05-07 MED ORDER — OXYCODONE HCL 5 MG PO TABS: 5.0000 mg | ORAL_TABLET | ORAL | 0 refills | Status: DC | PRN

## 2019-05-07 MED ORDER — IBUPROFEN 800 MG PO TABS: 800.0000 mg | ORAL_TABLET | Freq: Three times a day (TID) | ORAL | 0 refills | Status: AC

## 2019-05-07 MED FILL — oxyCODONE HCL 5 MG TABS: 5 | 5 days supply | Qty: 30 | Fill #0

## 2019-05-07 MED FILL — IBUPROFEN 800 MG TAB: 800 | 10 days supply | Qty: 30 | Fill #0

## 2019-05-07 NOTE — Discharge Instructions (Signed)
Vaginal Hysterectomy, Care After °Refer to this sheet in the next few weeks. These instructions provide you with information about caring for yourself after your procedure. Your health care provider may also give you more specific instructions. Your treatment has been planned according to current medical practices, but problems sometimes occur. Call your health care provider if you have any problems or questions after your procedure. °What can I expect after the procedure? °After the procedure, it is common to have: °· Pain. °· Soreness and numbness in your incision areas. °· Vaginal bleeding and discharge. °· Constipation. °· Temporary problems emptying the bladder. °· Feelings of sadness or other emotions. °Follow these instructions at home: °Medicines °· Take over-the-counter and prescription medicines only as told by your health care provider. °· If you were prescribed an antibiotic medicine, take it as told by your health care provider. Do not stop taking the antibiotic even if you start to feel better. °· Do not drive or operate heavy machinery while taking prescription pain medicine. °Activity °· Return to your normal activities as told by your health care provider. Ask your health care provider what activities are safe for you. °· Get regular exercise as told by your health care provider. You may be told to take short walks every day and go farther each time. °· Do not lift anything that is heavier than 10 lb (4.5 kg). °General instructions ° °· Do not put anything in your vagina for 6 weeks after your surgery or as told by your health care provider. This includes tampons and douches. °· Do not have sex until your health care provider says you can. °· Do not take baths, swim, or use a hot tub until your health care provider approves. °· Drink enough fluid to keep your urine clear or pale yellow. °· Do not drive for 24 hours if you were given a sedative. °· Keep all follow-up visits as told by your health  care provider. This is important. °Contact a health care provider if: °· Your pain medicine is not helping. °· You have a fever. °· You have redness, swelling, or pain at your incision site. °· You have blood, pus, or a bad-smelling discharge from your vagina. °· You continue to have difficulty urinating. °Get help right away if: °· You have severe abdominal or back pain. °· You have heavy bleeding from your vagina. °· You have chest pain or shortness of breath. °This information is not intended to replace advice given to you by your health care provider. Make sure you discuss any questions you have with your health care provider. °Document Released: 12/06/2015 Document Revised: 04/06/2016 Document Reviewed: 08/29/2015 °Elsevier Patient Education © 2020 [employer] ° °

## 2019-05-07 NOTE — Progress Notes (Signed)
Discharge instructions reviewed with pt and instructed on where to pick up prescriptions.  Pt verbalized understanding and had no questions.  Pt discharged in stable condition via wheelchair with friend.  Eliezer Bottom Worthington

## 2019-05-07 NOTE — Discharge Summary (Signed)
Physician Discharge Summary  Patient ID: Julia Price MRN: [id number] DOB/AGE: 1991-07-16 28 y.o.  Admit date: 05/06/2019 Discharge date: 05/07/2019  Admission Diagnoses: DUB  Discharge Diagnoses: same Active Problems:   DUB (dysfunctional uterine bleeding)   Post-operative state   Discharged Condition: good  Hospital Course: She underwent an uncomplicated TVH/BS. By POD #1 she was ambulating, voiding, and tolerating po well. She voiced her readiness to go home.  Consults: None  Significant Diagnostic Studies: labs: post op hbg was 11.8.  Treatments: surgery: TVH/BS  Discharge Exam: Blood pressure (!) 92/54, pulse (!) 56, temperature 98.2 F (36.8 C), temperature source Oral, resp. rate 18, height 5' 2.99" (1.6 m), weight 66.2 kg, last menstrual period 04/18/2019, SpO2 100 %. General appearance: alert Resp: clear to auscultation bilaterally Cardio: regular rate and rhythm, S1, S2 normal, no murmur, click, rub or gallop GI: soft, non-tender; bowel sounds normal; no masses,  no organomegaly  Disposition: home   Allergies as of 05/07/2019      Reactions   Latex Hives, Rash      Medication List    STOP taking these medications   megestrol 40 MG tablet Commonly known as: MEGACE     TAKE these medications   ibuprofen 800 MG tablet Commonly known as: ADVIL Take 1 tablet (800 mg total) by mouth every 8 (eight) hours. What changed:   medication strength  how much to take  when to take this  reasons to take this  additional instructions   multivitamin with minerals Tabs tablet Take 1 tablet by mouth daily.   oxyCODONE 5 MG immediate release tablet Commonly known as: Oxy IR/ROXICODONE Take 1-2 tablets (5-10 mg total) by mouth every 4 (four) hours as needed for moderate pain.   WILD YAM PO Take 2 capsules by mouth daily.     Tylenol 650 mg po q 4 hours prn  Follow-up Information    Camden Knotek C, MD. Schedule an appointment as soon as possible for a  visit in 4 week(s).   Specialty: Obstetrics and Gynecology Contact information: [address] [place] [address] [phone]           Signed: [name] 05/07/2019, 12:54 PM

## 2019-05-13 ENCOUNTER — Telehealth: Payer: Self-pay | Admitting: *Deleted

## 2019-05-13 NOTE — Telephone Encounter (Signed)
Received fax re: call to after hours nurse line stating Julia Price called requesting pain med refill for something stronger- states had hysterectomy a week ago .  I called [name] and we discussed she had a Vaginal Hysterectomy with Salpingectomy on 05/06/19. States she has been taking the oxycodone 5mg  - 2 tabs every 4 hours as needed and Ibuprofen 800 mg every 6 hours as needed and only has one Oxycodone left. Describes her pain as an 8 out of 10 and bad cramping pain. States it hurts a lot when she has BM but BM is liquid not constipated - was having constipation and took dulcolax.  States she is only having spotting bleeding. I advised her we do not refill pain meds usually because what was prescribed should be enough and also at this point she should not be requiring narcotics. I explained I can send request to Dr.Dove but she is not in the office and I cannot guarantee a refill or when she will respond. I advised her to go to ER if she feels she is having severe pain .  She states she will wait a day or two to see what Dr. Hulan Fray says but willl go to ER if needed.  [name],RN

## 2019-05-13 NOTE — Telephone Encounter (Signed)
Please see other telephone note dated 05/13/19 - patient was called.  Xaivier Malay,RN

## 2019-05-13 NOTE — Telephone Encounter (Signed)
Pt left message stating that she would like a call back.from a nurse.

## 2019-06-02 ENCOUNTER — Other Ambulatory Visit: Payer: Self-pay

## 2019-06-02 ENCOUNTER — Ambulatory Visit
Admission: RE | Admit: 2019-06-02 | Discharge: 2019-06-02 | Disposition: A | Discharge status: Home / Self Care | Payer: No Typology Code available for payment source | Source: Ambulatory Visit | Attending: Obstetrics and Gynecology | Admitting: Obstetrics and Gynecology

## 2019-06-02 ENCOUNTER — Other Ambulatory Visit (HOSPITAL_COMMUNITY): Payer: Self-pay | Admitting: Obstetrics and Gynecology

## 2019-06-02 DIAGNOSIS — N6320 Unspecified lump in the left breast, unspecified quadrant: Secondary | ICD-10-CM

## 2019-06-06 ENCOUNTER — Ambulatory Visit (INDEPENDENT_AMBULATORY_CARE_PROVIDER_SITE_OTHER): Payer: Self-pay | Admitting: Obstetrics & Gynecology

## 2019-06-06 ENCOUNTER — Encounter: Payer: Self-pay | Admitting: Obstetrics & Gynecology

## 2019-06-06 ENCOUNTER — Other Ambulatory Visit: Payer: Self-pay

## 2019-06-06 VITALS — BP 114/76 | HR 74 | Ht 63.0 in | Wt 145.0 lb

## 2019-06-06 DIAGNOSIS — Z9889 Other specified postprocedural states: Secondary | ICD-10-CM

## 2019-06-06 NOTE — Progress Notes (Signed)
Pt states pain everyday ,burning and cramps

## 2019-06-06 NOTE — Progress Notes (Signed)
   Subjective:    Patient ID: Julia Price, female    DOB: 08-26-1991, 28 y.o.   MRN: [id number]  HPI 28 yo single P1 here for a post op visit. She had a TVH/BS for DUB. She is doing well, reports normal bowel and bladder function. She has not had sex since surgery. She still feels very tired. 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  Her pathology was benign  Review of Systems She is in an off and on same sex relationship.    Objective:   Physical Exam Breathing, conversing, and ambulating normally Well nourished, well hydrated Black female, no apparent distress Abd- benign Spec exam normal, cuff healed well     Assessment & Plan:  Post op- stable Plan for her to return to work in 4 more weeks She declines a flu vaccine today.

## 2019-06-17 DIAGNOSIS — Z029 Encounter for administrative examinations, unspecified: Secondary | ICD-10-CM

## 2019-12-02 ENCOUNTER — Ambulatory Visit
Admission: RE | Admit: 2019-12-02 | Discharge: 2019-12-02 | Disposition: A | Discharge status: Home / Self Care | Payer: Self-pay | Source: Ambulatory Visit | Attending: Obstetrics and Gynecology | Admitting: Obstetrics and Gynecology

## 2019-12-02 ENCOUNTER — Other Ambulatory Visit: Payer: Self-pay

## 2019-12-02 DIAGNOSIS — N6320 Unspecified lump in the left breast, unspecified quadrant: Secondary | ICD-10-CM

## 2020-08-03 IMAGING — US ULTRASOUND RIGHT BREAST LIMITED
1 series · 13 of 19 positions shown · non-contrast
Comparison: None.

CLINICAL DATA: 27-year-old female presenting for evaluation
bilateral palpable lumps identified on clinical breast exam. The
exact location of the palpable areas were not included on the order
requisition. The patient believes that the palpable areas were in
the superior right breast and lower inner left breast.

EXAM:
ULTRASOUND OF THE BILATERAL BREASTS

[Series 1: ultrasound right breast limited · 0.04mm/px · 13 of 19 slices shown]
[im 1/19]
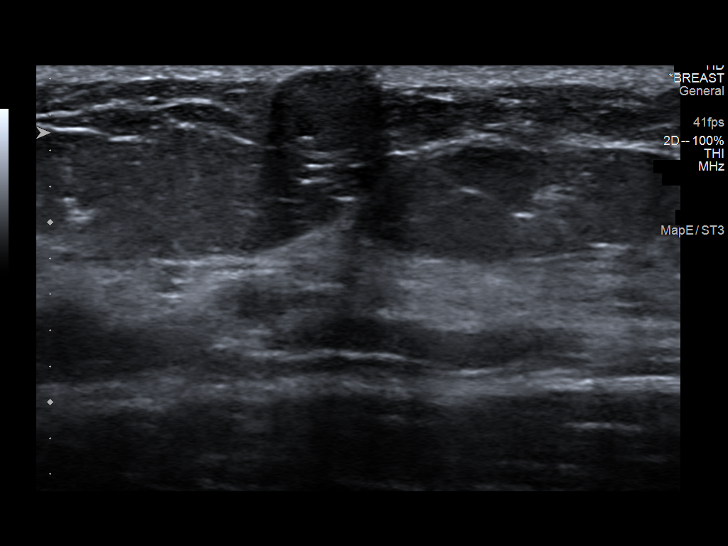
[im 3/19]
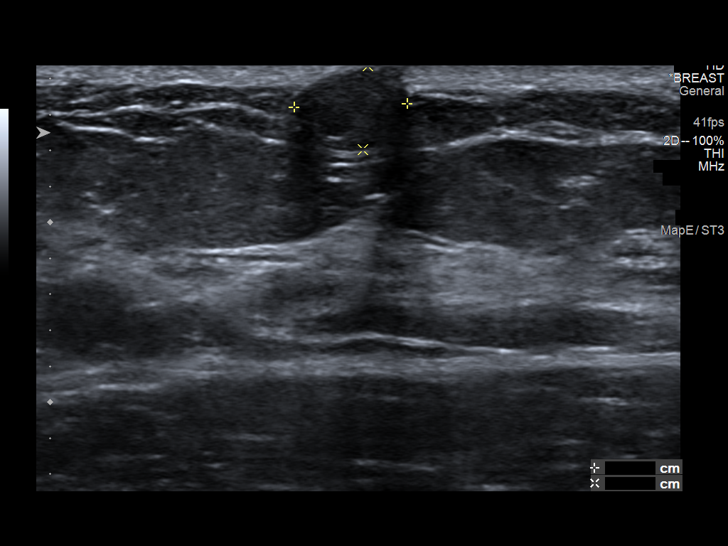
[im 4/19]
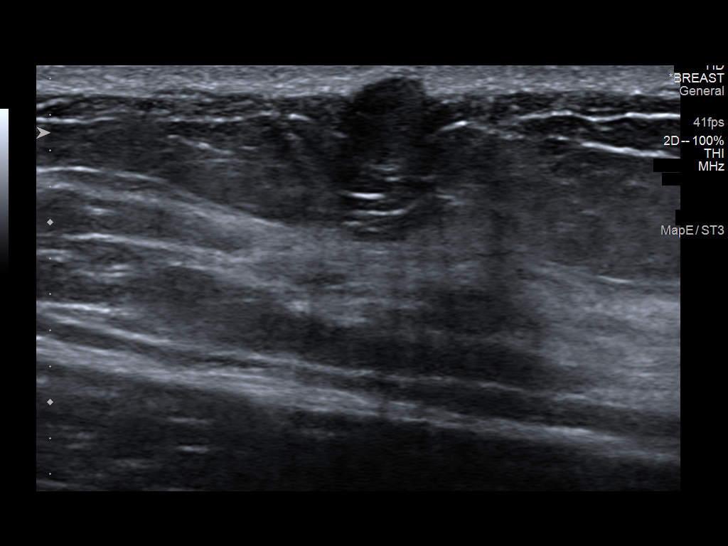
[im 6/19]
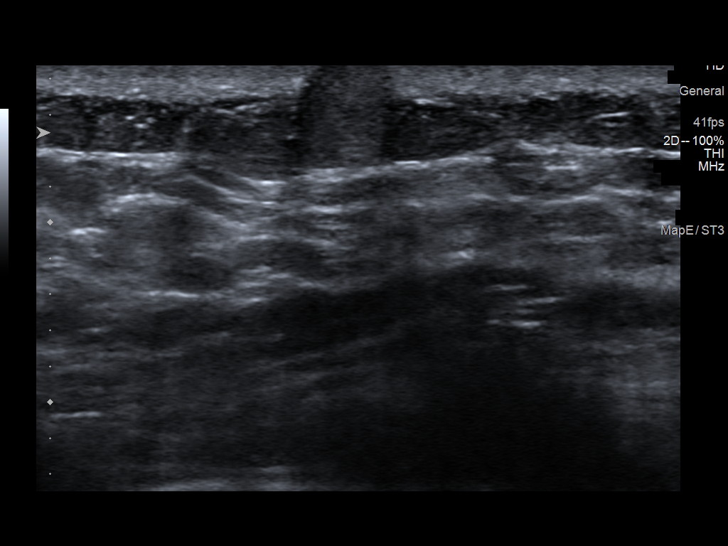
[im 7/19]
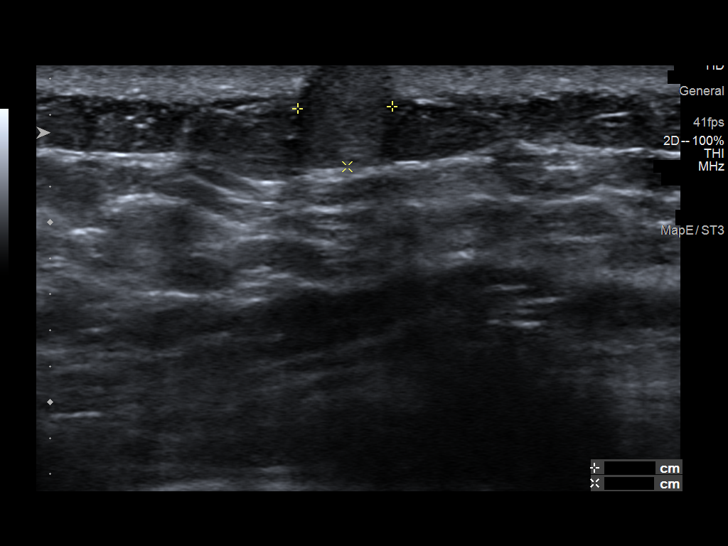
[im 9/19]
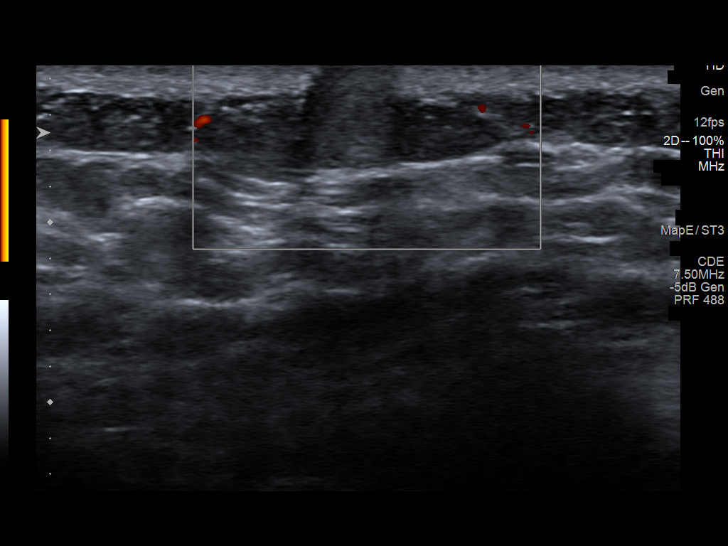
[im 10/19]
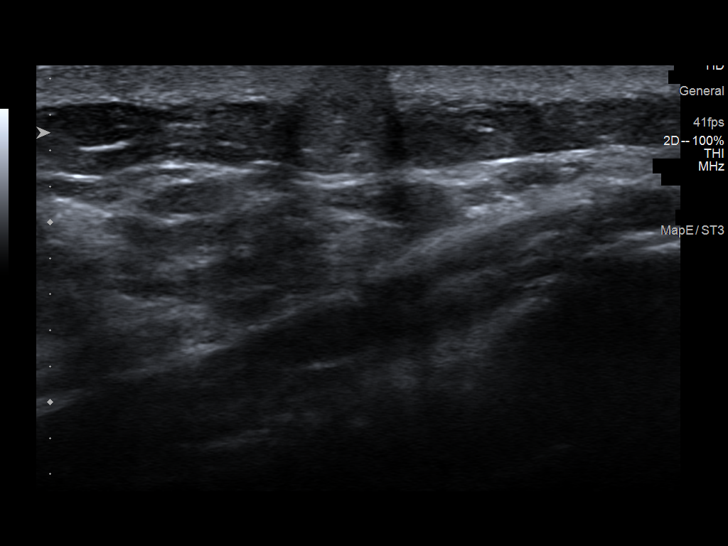
[im 11/19]
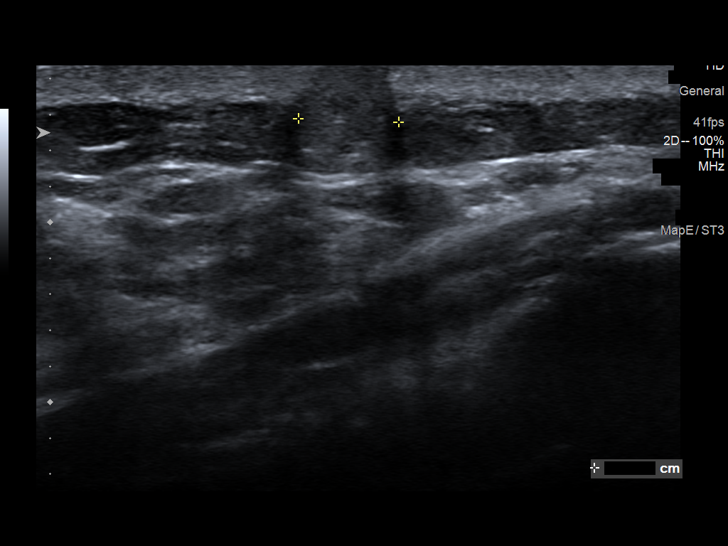
[im 13/19]
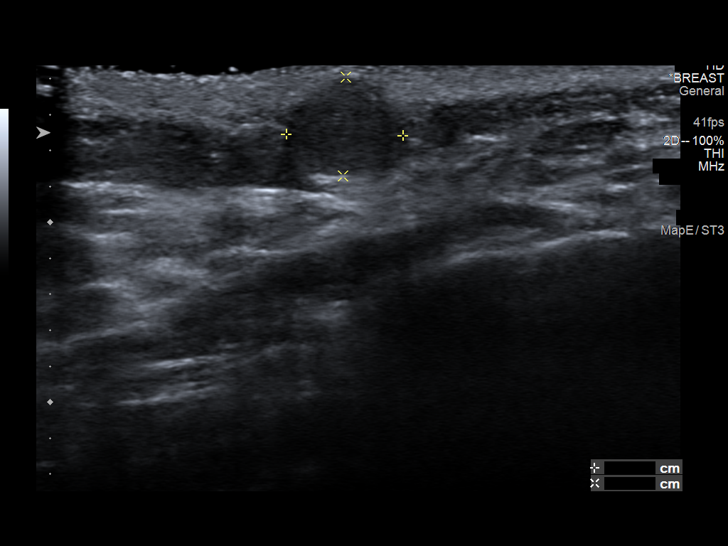
[im 14/19]
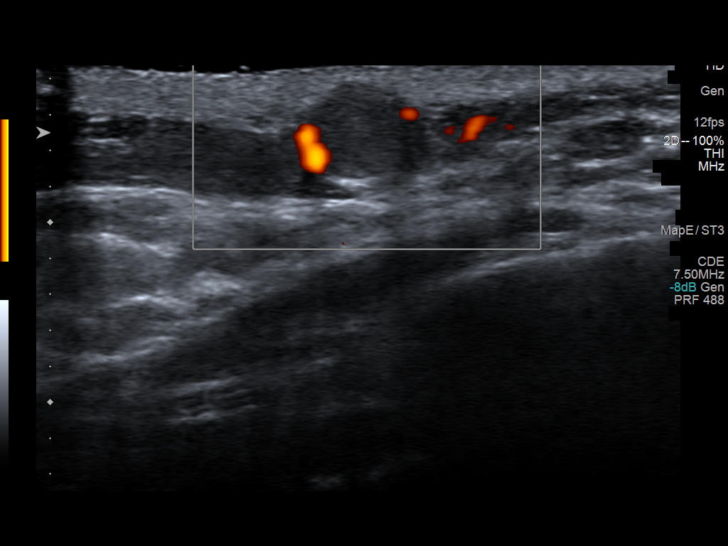
[im 16/19]
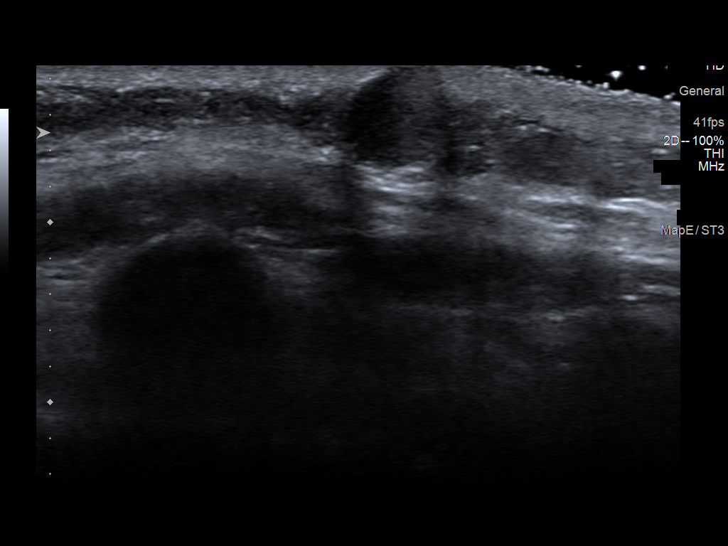
[im 17/19]
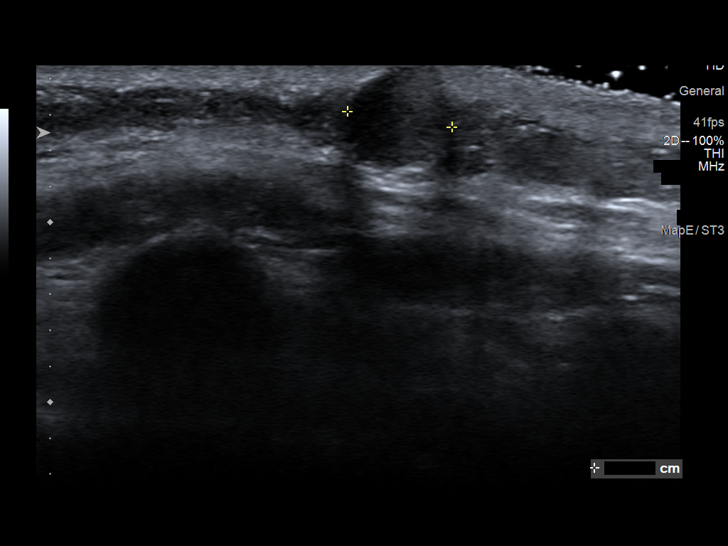
[im 19/19]
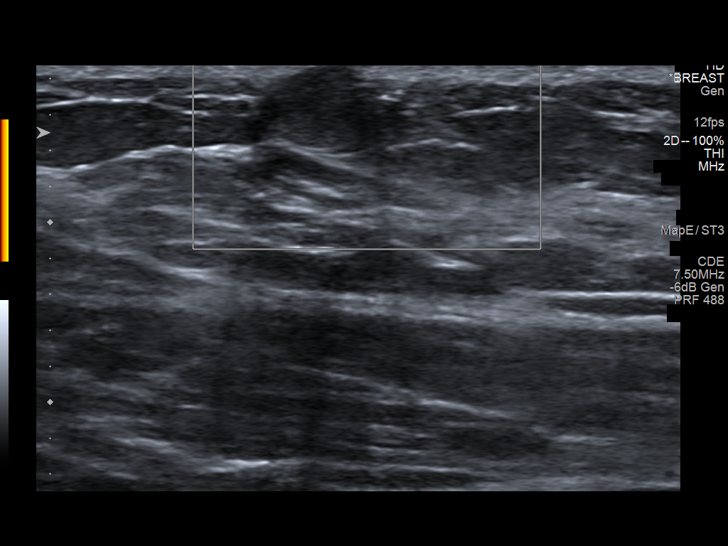

[13 of 19 positions shown; findings below may reference images not displayed]

FINDINGS: Ultrasound of the left breast at 6 o'clock, 4 cm from the nipple
demonstrates a circumscribed oval hypoechoic mass measuring 6 x 3 x
6 mm. No other suspicious masses are seen in the lower-inner
quadrant of the left breast which was scanned between 6 and 9
o'clock.

Ultrasound of the superior and upper inner right breast demonstrates
multiple skin lesions with tracts to the skin surface consistent
with sebaceous cysts.
IMPRESSION: 1. There is a probably benign 6 mm mass in the left breast at 6
o'clock. This is favored to represent a fibroadenoma.

2. Multiple sebaceous cysts are seen in the superior and upper inner
right breast.

RECOMMENDATION:
1.  Six-month follow-up left breast ultrasound is recommended.

2. If there are palpable areas which were not covered by today's
ultrasound, the patient may return for additional imaging once the
specific locations are documented.

I have discussed the findings and recommendations with the patient.
Results were also provided in writing at the conclusion of the
visit. If applicable, a reminder letter will be sent to the patient
regarding the next appointment.

BI-RADS CATEGORY  3: Probably benign.

## 2020-09-20 ENCOUNTER — Other Ambulatory Visit: Payer: Self-pay

## 2020-09-20 ENCOUNTER — Encounter (HOSPITAL_COMMUNITY): Payer: Self-pay | Admitting: *Deleted

## 2020-09-20 ENCOUNTER — Emergency Department (HOSPITAL_COMMUNITY)
Admission: EM | Admit: 2020-09-20 | Discharge: 2020-09-20 | Disposition: A | Discharge status: Home / Self Care | Payer: Self-pay | Attending: Emergency Medicine | Admitting: Emergency Medicine

## 2020-09-20 DIAGNOSIS — Z5321 Procedure and treatment not carried out due to patient leaving prior to being seen by health care provider: Secondary | ICD-10-CM | POA: Insufficient documentation

## 2020-09-20 DIAGNOSIS — J069 Acute upper respiratory infection, unspecified: Secondary | ICD-10-CM | POA: Insufficient documentation

## 2020-09-20 DIAGNOSIS — R112 Nausea with vomiting, unspecified: Secondary | ICD-10-CM | POA: Insufficient documentation

## 2020-09-20 DIAGNOSIS — R519 Headache, unspecified: Secondary | ICD-10-CM | POA: Insufficient documentation

## 2020-09-20 LAB — COMPREHENSIVE METABOLIC PANEL
ALT: 14 U/L (ref 0–44)
AST: 19 U/L (ref 15–41)
Albumin: 4.1 g/dL (ref 3.5–5.0)
Alkaline Phosphatase: 45 U/L (ref 38–126)
Anion gap: 9 (ref 5–15)
BUN: 10 mg/dL (ref 6–20)
CO2: 22 mmol/L (ref 22–32)
Calcium: 9.2 mg/dL (ref 8.9–10.3)
Chloride: 105 mmol/L (ref 98–111)
Creatinine, Ser: 0.69 mg/dL (ref 0.44–1.00)
GFR, Estimated: >60 mL/min (ref >60)
Glucose, Bld: 101 mg/dL — ABNORMAL HIGH (ref 70–99)
Potassium: 3.7 mmol/L (ref 3.5–5.1)
Sodium: 136 mmol/L (ref 135–145)
Total Bilirubin: 0.6 mg/dL (ref 0.3–1.2)
Total Protein: 7.1 g/dL (ref 6.5–8.1)

## 2020-09-20 LAB — CBC
HCT: 42.8 % (ref 36.0–46.0)
Hemoglobin: 14.1 g/dL (ref 12.0–15.0)
MCH: 27.9 pg (ref 26.0–34.0)
MCHC: 32.9 g/dL (ref 30.0–36.0)
MCV: 84.6 fL (ref 80.0–100.0)
Platelets: 255 10*3/uL (ref 150–400)
RBC: 5.06 MIL/uL (ref 3.87–5.11)
RDW: 13.5 % (ref 11.5–15.5)
WBC: 7.8 10*3/uL (ref 4.0–10.5)
nRBC: 0 % (ref 0.0–0.2)

## 2020-09-20 NOTE — ED Notes (Signed)
Pt left. 

## 2020-09-20 NOTE — ED Triage Notes (Signed)
Pt has multiple complaints, reports not feeling well x 2 weeks with URI, headache, sore throat and had n/v today. No acute distress is noted at triage.

## 2020-09-21 ENCOUNTER — Encounter (HOSPITAL_COMMUNITY): Payer: Self-pay

## 2020-09-21 ENCOUNTER — Ambulatory Visit (HOSPITAL_COMMUNITY)
Admission: EM | Admit: 2020-09-21 | Discharge: 2020-09-21 | Disposition: A | Discharge status: Home / Self Care | Payer: HRSA Program | Attending: Family Medicine | Admitting: Family Medicine

## 2020-09-21 DIAGNOSIS — Z7952 Long term (current) use of systemic steroids: Secondary | ICD-10-CM | POA: Diagnosis not present

## 2020-09-21 DIAGNOSIS — R1110 Vomiting, unspecified: Secondary | ICD-10-CM | POA: Diagnosis not present

## 2020-09-21 DIAGNOSIS — J069 Acute upper respiratory infection, unspecified: Secondary | ICD-10-CM

## 2020-09-21 DIAGNOSIS — Z79899 Other long term (current) drug therapy: Secondary | ICD-10-CM | POA: Insufficient documentation

## 2020-09-21 DIAGNOSIS — R071 Chest pain on breathing: Secondary | ICD-10-CM | POA: Insufficient documentation

## 2020-09-21 DIAGNOSIS — R0781 Pleurodynia: Secondary | ICD-10-CM

## 2020-09-21 DIAGNOSIS — Z91040 Latex allergy status: Secondary | ICD-10-CM | POA: Insufficient documentation

## 2020-09-21 DIAGNOSIS — F17210 Nicotine dependence, cigarettes, uncomplicated: Secondary | ICD-10-CM | POA: Diagnosis not present

## 2020-09-21 DIAGNOSIS — U071 COVID-19: Secondary | ICD-10-CM | POA: Diagnosis present

## 2020-09-21 LAB — SARS CORONAVIRUS 2 (TAT 6-24 HRS): SARS Coronavirus 2: POSITIVE — AB

## 2020-09-21 MED ORDER — PROMETHAZINE-DM 6.25-15 MG/5ML PO SYRP: 5.0000 mL | ORAL_SOLUTION | Freq: Four times a day (QID) | ORAL | 0 refills | Status: DC | PRN

## 2020-09-21 MED ORDER — ALBUTEROL SULFATE HFA 108 (90 BASE) MCG/ACT IN AERS: 1.0000 | INHALATION_SPRAY | Freq: Four times a day (QID) | RESPIRATORY_TRACT | 0 refills | Status: DC | PRN

## 2020-09-21 MED ORDER — PREDNISONE 20 MG PO TABS: 40.0000 mg | ORAL_TABLET | Freq: Every day | ORAL | 0 refills | Status: DC

## 2020-09-21 NOTE — ED Triage Notes (Signed)
Pt c/o central CP and upper abdominal discomfort "burning" in nature from larynx to mid-epigastric area, sharp when eating/swallowing, pain radiating to mid back onset yesterday that has since resolved.  Pt reports n/v with multiple episodes of emesis yesterday. No emesis today.  Reports cough, congestion, runny nose, HA for approx 2 weeks.   Had EKG, and lab work performed in ED yesterday but LWBS by provider. Pt alert.  Denies fever, diaphoresis, SOB. Able to speak full sentences w/o difficulty.  Took ibuprofen at approx 0200.  Pt states she was evaluated at [facility] yesterday and had COVID test performed, awaiting results and was advised by same providers to go to ED yesterday for further eval.

## 2020-09-21 NOTE — ED Provider Notes (Signed)
[facility]    CSN: [id number] Arrival date & time: 09/21/20  1159      History   Chief Complaint Chief Complaint  Patient presents with  . Sore Throat  . Chest Pain    HPI Julia Price is a 30 y.o. female.   Here today with about 2 weeks of sinus drainage, facial pain and pressure, cough, sore throat, fatigue, headache and now the past few days having chest pain with deep breaths and coughing in the center of chest extending outward. Also had some episodes of vomiting yesterday but tolerated breakfast this morning well. So far has been taking nyquil, mucinex, robitussin with mild temporary relief. Went to ED yesterday but LWBS, EKG was NSR without acute ST or T wave changes, basic labs reviewed and benign at this time. States multiple family members at home positive for COVID. Hx of smoking, bronchitis but no known hx of asthma or COPD.      Past Medical History:  Diagnosis Date  . Abnormal Pap smear of vagina   . Arthritis   . Intractable cyclical vomiting     Patient Active Problem List   Diagnosis Date Noted  . DUB (dysfunctional uterine bleeding) 05/06/2019  . Post-operative state 05/06/2019  . Low grade squamous intraepith lesion on cytologic smear cervix (lgsil) 10/02/2018  . Abnormal uterine bleeding (AUB) 04/28/2018  . Acute bronchitis 07/27/2014  . Vaginal pain 09/08/2013  . Eczema 09/08/2013  . Hematemesis 12/06/2011  . Nipple problem 11/30/2011  . Low back strain 06/19/2011  . Sickle-cell trait (HCC) 01/29/2008  . TOBACCO ABUSE, HX OF 01/29/2008    Past Surgical History:  Procedure Laterality Date  . NO PAST SURGERIES    . VAGINAL HYSTERECTOMY Bilateral 05/06/2019   Procedure: HYSTERECTOMY VAGINAL WITH SALPINGECTOMY;  Surgeon: Allie Bossier, MD;  Location: [facility] OR;  Service: Gynecology;  Laterality: Bilateral;    OB History    Gravida  1   Para  1   Term  1   Preterm      AB      Living  1     SAB      IAB      Ectopic       Multiple      Live Births  1            Home Medications    Prior to Admission medications   Medication Sig Start Date End Date Taking? Authorizing Provider  albuterol (VENTOLIN HFA) 108 (90 Base) MCG/ACT inhaler Inhale 1-2 puffs into the lungs every 6 (six) hours as needed for wheezing or shortness of breath. 09/21/20  Yes Particia Nearing, PA-C  ibuprofen (ADVIL) 800 MG tablet Take 1 tablet (800 mg total) by mouth every 8 (eight) hours. 05/07/19  Yes Dove, Myra C, MD  predniSONE (DELTASONE) 20 MG tablet Take 2 tablets (40 mg total) by mouth daily with breakfast. 09/21/20  Yes Particia Nearing, PA-C  promethazine-dextromethorphan (PROMETHAZINE-DM) 6.25-15 MG/5ML syrup Take 5 mLs by mouth 4 (four) times daily as needed for cough. 09/21/20  Yes Particia Nearing, PA-C  Multiple Vitamin (MULTIVITAMIN WITH MINERALS) TABS tablet Take 1 tablet by mouth daily.    [provider]  oxyCODONE (OXY IR/ROXICODONE) 5 MG immediate release tablet Take 1-2 tablets (5-10 mg total) by mouth every 4 (four) hours as needed for moderate pain. Patient not taking: Reported on 06/06/2019 05/07/19   Allie Bossier, MD  [place], Dioscorea villosa, (WILD Helen  PO) Take 2 capsules by mouth daily.    [provider]    Family History Family History  Problem Relation Age of Onset  . Diabetes Maternal Grandfather   . Hypertension Maternal Grandfather     Social History Social History   Tobacco Use  . Smoking status: Current Some Day Smoker    Packs/day: 1.00    Types: Cigarettes    Last attempt to quit: 08/28/2014    Years since quitting: 6.0  . Smokeless tobacco: Never Used  Vaping Use  . Vaping Use: Never used  Substance Use Topics  . Alcohol use: Yes    Comment: occassionally  . Drug use: Yes    Types: Marijuana    Comment: very occasional      Allergies   Latex   Review of Systems Review of Systems PER HPI    Physical Exam Triage Vital Signs ED  Triage Vitals  Enc Vitals Group     BP 09/21/20 1239 115/73     Pulse Rate 09/21/20 1239 76     Resp 09/21/20 1239 18     Temp 09/21/20 1239 98.4 F (36.9 C)     Temp Source 09/21/20 1239 Oral     SpO2 09/21/20 1239 98 %     Weight --      Height --      Head Circumference --      Peak Flow --      Pain Score 09/21/20 1236 10     Pain Loc --      Pain Edu? --      Excl. in GC? --    No data found.  Updated Vital Signs BP 115/73 (BP Location: Left Arm)   Pulse 76   Temp 98.4 F (36.9 C) (Oral)   Resp 18   LMP 04/18/2019   SpO2 98%   Visual Acuity Right Eye Distance:   Left Eye Distance:   Bilateral Distance:    Right Eye Near:   Left Eye Near:    Bilateral Near:     Physical Exam Vitals and nursing note reviewed.  Constitutional:      Appearance: Normal appearance. She is not ill-appearing.  HENT:     Head: Atraumatic.     Right Ear: Tympanic membrane normal.     Left Ear: Tympanic membrane normal.     Nose: Congestion present.     Mouth/Throat:     Mouth: Mucous membranes are moist.     Pharynx: Posterior oropharyngeal erythema present.  Eyes:     Extraocular Movements: Extraocular movements intact.     Conjunctiva/sclera: Conjunctivae normal.  Cardiovascular:     Rate and Rhythm: Normal rate and regular rhythm.     Heart sounds: Normal heart sounds.  Pulmonary:     Effort: Pulmonary effort is normal. No respiratory distress.     Breath sounds: Normal breath sounds. No wheezing or rales.  Chest:     Chest wall: Tenderness (mild mid chest ttp) present.  Abdominal:     General: Bowel sounds are normal. There is no distension.     Palpations: Abdomen is soft.     Tenderness: There is no abdominal tenderness. There is no guarding.  Musculoskeletal:        General: Normal range of motion.     Cervical back: Normal range of motion and neck supple.  Skin:    General: Skin is warm and dry.  Neurological:     Mental Status: She is alert  and oriented to  person, place, and time.     Motor: No weakness.     Gait: Gait normal.  Psychiatric:        Mood and Affect: Mood normal.        Thought Content: Thought content normal.        Judgment: Judgment normal.      UC Treatments / Results  Labs (all labs ordered are listed, but only abnormal results are displayed) Labs Reviewed  SARS CORONAVIRUS 2 (TAT 6-24 HRS)    EKG   Radiology No results found.  Procedures Procedures (including critical care time)  Medications Ordered in UC Medications - No data to display  Initial Impression / Assessment and Plan / UC Course  I have reviewed the triage vital signs and the nursing notes.  Pertinent labs & imaging results that were available during my care of the patient were reviewed by me and considered in my medical decision making (see chart for details).     Strong suspicion for COVID 19 given sxs and exposures, COVID pcr pending, labs and EKG reviewed from ED yesterday and reassuring. Vital signs WNL and exam benign today. Will treat with prednisone burst, albuterol inhaler and phenergan DM. Strict return precautions reviewed for acutely worsening sxs.   Final Clinical Impressions(s) / UC Diagnoses   Final diagnoses:  Viral upper respiratory tract infection  Pleuritic chest pain   Discharge Instructions   None    ED Prescriptions    Medication Sig Dispense Auth. Provider   predniSONE (DELTASONE) 20 MG tablet Take 2 tablets (40 mg total) by mouth daily with breakfast. 10 tablet Particia Nearing, PA-C   albuterol (VENTOLIN HFA) 108 (90 Base) MCG/ACT inhaler Inhale 1-2 puffs into the lungs every 6 (six) hours as needed for wheezing or shortness of breath. 18 g Roosvelt Maser [place], [place]   promethazine-dextromethorphan (PROMETHAZINE-DM) 6.25-15 MG/5ML syrup Take 5 mLs by mouth 4 (four) times daily as needed for cough. 100 mL Particia Nearing, [place]     PDMP not reviewed this encounter.   Particia Nearing,  [place] 09/21/20 1326

## 2020-09-22 ENCOUNTER — Telehealth (HOSPITAL_COMMUNITY): Payer: Self-pay

## 2021-07-16 ENCOUNTER — Other Ambulatory Visit: Payer: Self-pay

## 2021-07-16 ENCOUNTER — Encounter (HOSPITAL_BASED_OUTPATIENT_CLINIC_OR_DEPARTMENT_OTHER): Payer: Self-pay | Admitting: Emergency Medicine

## 2021-07-16 ENCOUNTER — Emergency Department (HOSPITAL_BASED_OUTPATIENT_CLINIC_OR_DEPARTMENT_OTHER): Payer: Self-pay

## 2021-07-16 ENCOUNTER — Emergency Department (HOSPITAL_BASED_OUTPATIENT_CLINIC_OR_DEPARTMENT_OTHER)
Admission: EM | Admit: 2021-07-16 | Discharge: 2021-07-16 | Disposition: A | Discharge status: Home / Self Care | Payer: Self-pay | Attending: Emergency Medicine | Admitting: Emergency Medicine

## 2021-07-16 DIAGNOSIS — M79642 Pain in left hand: Secondary | ICD-10-CM | POA: Insufficient documentation

## 2021-07-16 DIAGNOSIS — Z91040 Latex allergy status: Secondary | ICD-10-CM | POA: Insufficient documentation

## 2021-07-16 DIAGNOSIS — M79641 Pain in right hand: Secondary | ICD-10-CM

## 2021-07-16 DIAGNOSIS — F17210 Nicotine dependence, cigarettes, uncomplicated: Secondary | ICD-10-CM | POA: Insufficient documentation

## 2021-07-16 DIAGNOSIS — R200 Anesthesia of skin: Secondary | ICD-10-CM | POA: Insufficient documentation

## 2021-07-16 MED ORDER — PREDNISONE 20 MG PO TABS: 40.0000 mg | ORAL_TABLET | Freq: Every day | ORAL | 0 refills | Status: AC

## 2021-07-16 MED ORDER — KETOROLAC TROMETHAMINE 30 MG/ML IJ SOLN
30.0000 mg | Freq: Once | INTRAMUSCULAR | Status: AC
  Administered 2021-07-16: 30 mg via INTRAMUSCULAR
  Filled 2021-07-16: qty 1

## 2021-07-16 NOTE — ED Triage Notes (Signed)
Pt c/o LT hand pain since Wed; she was working on a car and something fell on it

## 2021-07-16 NOTE — Discharge Instructions (Signed)
You were seen in the emergency department today for pain in the left ring finger after an injury while working on your car.  While you were here we did an x-ray which did not show any fractures or dislocations.  It is likely that you may have pinched the nerve that runs along the outside of your arm.  I am giving you a short course of steroids to reduce inflammation and a sling for you to wear at night which may be helpful.  Also giving information to follow-up with sports medicine if you continue to have symptoms of numbness.

## 2021-07-16 NOTE — ED Provider Notes (Signed)
[facility] EMERGENCY DEPARTMENT Provider Note   CSN: [id number] Arrival date & time: 07/16/21  1157     History Chief Complaint  Patient presents with   Hand Pain    Julia Price is a 30 y.o. female.  With no significant past medical history presents emergency department with left hand pain.  She states that on Wednesday afternoon she was helping her uncle with car work.  She states that her hand was up in the car when something slipped and hit her left ring finger.  She is unsure what the object was.  She states immediately after she had some pain and swelling to the finger.  She states that since then she has had ongoing pain and numbness to the ring finger and pain to the ulnar aspect of the left arm and left elbow.  She denies fevers, other injuries, previous surgeries to the hand or extremity.  Denies any spreading redness or warmth.   Hand Pain      Past Medical History:  Diagnosis Date   Abnormal Pap smear of vagina    Arthritis    Intractable cyclical vomiting     Patient Active Problem List   Diagnosis Date Noted   DUB (dysfunctional uterine bleeding) 05/06/2019   Post-operative state 05/06/2019   Low grade squamous intraepith lesion on cytologic smear cervix (lgsil) 10/02/2018   Abnormal uterine bleeding (AUB) 04/28/2018   Acute bronchitis 07/27/2014   Vaginal pain 09/08/2013   Eczema 09/08/2013   Hematemesis 12/06/2011   Nipple problem 11/30/2011   Low back strain 06/19/2011   Sickle-cell trait (HCC) 01/29/2008   TOBACCO ABUSE, HX OF 01/29/2008    Past Surgical History:  Procedure Laterality Date   NO PAST SURGERIES     VAGINAL HYSTERECTOMY Bilateral 05/06/2019   Procedure: HYSTERECTOMY VAGINAL WITH SALPINGECTOMY;  Surgeon: Allie Bossier, MD;  Location: [facility] OR;  Service: Gynecology;  Laterality: Bilateral;     OB History     Gravida  1   Para  1   Term  1   Preterm      AB      Living  1      SAB      IAB       Ectopic      Multiple      Live Births  1           Family History  Problem Relation Age of Onset   Diabetes Maternal Grandfather    Hypertension Maternal Grandfather     Social History   Tobacco Use   Smoking status: Some Days    Packs/day: 1.00    Types: Cigarettes    Last attempt to quit: 08/28/2014    Years since quitting: 6.8   Smokeless tobacco: Never  Vaping Use   Vaping Use: Never used  Substance Use Topics   Alcohol use: Yes    Comment: occassionally   Drug use: Yes    Types: Marijuana    Comment: very occasional     Home Medications Prior to Admission medications   Medication Sig Start Date End Date Taking? Authorizing Provider  albuterol (VENTOLIN HFA) 108 (90 Base) MCG/ACT inhaler Inhale 1-2 puffs into the lungs every 6 (six) hours as needed for wheezing or shortness of breath. 09/21/20   Particia Nearing, PA-C  ibuprofen (ADVIL) 800 MG tablet Take 1 tablet (800 mg total) by mouth every 8 (eight) hours. 05/07/19   Allie Bossier, MD  Multiple  Vitamin (MULTIVITAMIN WITH MINERALS) TABS tablet Take 1 tablet by mouth daily.    [provider]  oxyCODONE (OXY IR/ROXICODONE) 5 MG immediate release tablet Take 1-2 tablets (5-10 mg total) by mouth every 4 (four) hours as needed for moderate pain. Patient not taking: Reported on 06/06/2019 05/07/19   Allie Bossier, MD  predniSONE (DELTASONE) 20 MG tablet Take 2 tablets (40 mg total) by mouth daily with breakfast. 09/21/20   Particia Nearing, PA-C  promethazine-dextromethorphan (PROMETHAZINE-DM) 6.25-15 MG/5ML syrup Take 5 mLs by mouth 4 (four) times daily as needed for cough. 09/21/20   Particia Nearing, PA-C  Wild [place], Dioscorea villosa, (WILD YAM PO) Take 2 capsules by mouth daily.    [provider]    Allergies    Latex  Review of Systems   Review of Systems  Constitutional:  Negative for fever.  Musculoskeletal:  Positive for arthralgias and joint swelling.  Neurological:   Positive for numbness. Negative for weakness.  All other systems reviewed and are negative.  Physical Exam Updated Vital Signs BP 117/78 (BP Location: Right Arm)   Pulse 84   Temp 98.4 F (36.9 C) (Oral)   Resp 18   Ht 5\' 3"  (1.6 m)   Wt 66.7 kg   LMP 04/18/2019   SpO2 100%   BMI 26.04 kg/m   Physical Exam Vitals and nursing note reviewed.  Constitutional:      General: She is not in acute distress.    Appearance: Normal appearance. She is not ill-appearing or toxic-appearing.  HENT:     Head: Normocephalic and atraumatic.  Eyes:     General: No scleral icterus. Cardiovascular:     Pulses: Normal pulses.  Pulmonary:     Effort: Pulmonary effort is normal. No respiratory distress.  Musculoskeletal:     Left elbow: Normal range of motion. No tenderness.     Right hand: Normal.     Left hand: Bony tenderness present. Decreased sensation of the ulnar distribution. Normal capillary refill. Normal pulse.     Cervical back: Normal range of motion and neck supple. No tenderness.     Comments: Mild swelling to the left ring finger with small ecchymoses to the distal tip of the phalanx.  Patient complains of numbness to the left ring and little finger.  Numbness extends to the ulnar distribution of the hand, forearm up to the elbow.  Strength 4/5 secondary to pain with grip.  Pulses 2+ radial  Skin:    General: Skin is warm and dry.     Capillary Refill: Capillary refill takes less than 2 seconds.     Findings: No rash.  Neurological:     Mental Status: She is alert and oriented to person, place, and time.     Sensory: Sensory deficit present.     Motor: No weakness.  Psychiatric:        Mood and Affect: Mood normal.        Behavior: Behavior normal.        Thought Content: Thought content normal.        Judgment: Judgment normal.    ED Results / Procedures / Treatments   Labs (all labs ordered are listed, but only abnormal results are displayed) Labs Reviewed - No data to  display  EKG None  Radiology DG Hand Complete Left  Result Date: 07/16/2021 CLINICAL DATA:  Left hand pain for 3 days.  Trauma 3 days ago. EXAM: LEFT HAND - COMPLETE 3+ VIEW COMPARISON:  None. FINDINGS: There is no evidence of fracture or dislocation. There is no evidence of arthropathy or other focal bone abnormality. Soft tissues are unremarkable. IMPRESSION: Negative. Electronically Signed   By: Gerome Sam III M.D.   On: 07/16/2021 13:27    Procedures Procedures   Medications Ordered in ED Medications  ketorolac (TORADOL) 30 MG/ML injection 30 mg (30 mg Intramuscular Given 07/16/21 1504)    ED Course  I have reviewed the triage vital signs and the nursing notes.  Pertinent labs & imaging results that were available during my care of the patient were reviewed by me and considered in my medical decision making (see chart for details).    MDM Rules/Calculators/A&P 30 year old female who presents emergency department with left hand pain and numbness.  X-ray of the hand shows no evidence of fracture or dislocation. Physical exam is unremarkable for severe swelling or deformities.  There is no laceration.  She does have ulnar distribution of numbness and tingling of the left hand, forearm up to the left elbow.  Question of whether the injury to the hand because shock/injury to the ulnar nerve. Given IM Toradol here in the emergency department for pain. We will give her a dose of steroids for ulnar neuropathy/cubital tunnel syndrome.  Also providing her with a sling for sleep to keep arm at 90 degrees.  I am having her follow-up with Clare Gandy, MD sports medicine if she has ongoing numbness or tingling.  She is instructed to return to emergency department if she has worsening weakness of the hand or arm. Final Clinical Impression(s) / ED Diagnoses Final diagnoses:  Right hand pain    Rx / DC Orders ED Discharge Orders          Ordered    predniSONE (DELTASONE) 20 MG  tablet  Daily        07/16/21 1449             Cristopher Peru, PA-C 07/16/21 1614    Alvira Monday, MD 07/17/21 [phone]

## 2021-12-16 ENCOUNTER — Other Ambulatory Visit: Payer: Self-pay

## 2021-12-16 ENCOUNTER — Emergency Department (HOSPITAL_BASED_OUTPATIENT_CLINIC_OR_DEPARTMENT_OTHER)
Admission: EM | Admit: 2021-12-16 | Discharge: 2021-12-16 | Disposition: A | Discharge status: Home / Self Care | Payer: BC Managed Care – PPO | Attending: Emergency Medicine | Admitting: Emergency Medicine

## 2021-12-16 ENCOUNTER — Encounter (HOSPITAL_BASED_OUTPATIENT_CLINIC_OR_DEPARTMENT_OTHER): Payer: Self-pay

## 2021-12-16 DIAGNOSIS — J302 Other seasonal allergic rhinitis: Secondary | ICD-10-CM | POA: Diagnosis not present

## 2021-12-16 DIAGNOSIS — L299 Pruritus, unspecified: Secondary | ICD-10-CM | POA: Diagnosis present

## 2021-12-16 MED ORDER — ALBUTEROL SULFATE HFA 108 (90 BASE) MCG/ACT IN AERS: 1.0000 | INHALATION_SPRAY | Freq: Four times a day (QID) | RESPIRATORY_TRACT | 0 refills | Status: DC | PRN

## 2021-12-16 MED ORDER — FLUTICASONE PROPIONATE 50 MCG/ACT NA SUSP: 2.0000 | Freq: Every day | NASAL | 0 refills | Status: AC

## 2021-12-16 MED ORDER — LEVOCETIRIZINE DIHYDROCHLORIDE 5 MG PO TABS: 5.0000 mg | ORAL_TABLET | Freq: Every evening | ORAL | 0 refills | Status: DC

## 2021-12-16 NOTE — ED Triage Notes (Signed)
Pt reports allergies for past 3-4 days. Taking OTC meds without relief. Cough, nasal congestion, itching throat ?

## 2021-12-16 NOTE — Discharge Instructions (Addendum)
Take medications as prescribed and follow with your primary care doctor.  Some allergy medications can be sedating and you want to be sure you are not driving or operating heavy machinery while taking medications that can make you drowsy.  Please try these at home first.  ?

## 2021-12-16 NOTE — ED Provider Notes (Signed)
? ?  Emergency Department Provider Note ? ? ?I have reviewed the triage vital signs and the nursing notes. ? ? ?HISTORY ? ?Chief Complaint ?Allergies ? ? ?HPI ?Julia Price is a 31 y.o. female presents to the emergency department for evaluation of seasonal allergy symptoms.  She describes itchy nose with congestion, cough.  She states that she has tried some over-the-counter allergy medication and Benadryl at night but continues to have congestion.  She notes occasionally feeling short of breath but no appreciable wheezing.  No chest pain.  No fevers.  She states that symptoms been persistent over the past several weeks and it is causing her to miss work at times ? ? ?Past Medical History:  ?Diagnosis Date  ? Abnormal Pap smear of vagina   ? Arthritis   ? Intractable cyclical vomiting   ? ? ?Review of Systems ? ?Constitutional: No fever/chills ?Eyes: No visual changes. ?ENT: Positive congestion and watery eyes. ?Cardiovascular: Denies chest pain. ?Respiratory: Occasional shortness of breath. Positive cough.  ? ? ?____________________________________________ ? ? ?PHYSICAL EXAM: ? ?VITAL SIGNS: ?Vitals:  ? 12/16/21 0830 12/16/21 0835  ?BP: 116/77   ?Pulse: 73 67  ?Resp: 20 20  ?Temp:    ?SpO2: 99% 100%  ? ? ?Constitutional: Alert and oriented. Well appearing and in no acute distress. ?Eyes: Conjunctivae are slightly injected and watery.  ?Head: Atraumatic. ?Nose: Mild congestion/rhinnorhea. ?Mouth/Throat: Mucous membranes are moist.   ?Neck: No stridor.   ?Cardiovascular: Normal rate, regular rhythm. Good peripheral circulation. Grossly normal heart sounds.   ?Respiratory: Normal respiratory effort.  No retractions. Lungs CTAB. ?Gastrointestinal: No distention.  ?Musculoskeletal: No gross deformities of extremities. ?Neurologic:  Normal speech and language. ?Skin:  Skin is warm, dry and intact. No rash noted. ? ? ?____________________________________________ ? ? ?PROCEDURES ? ?Procedure(s) performed:   ? ?Procedures ? ?None  ?____________________________________________ ? ? ?INITIAL IMPRESSION / ASSESSMENT AND PLAN / ED COURSE ? ?Pertinent labs & imaging results that were available during my care of the patient were reviewed by me and considered in my medical decision making (see chart for details). ?  ? ?Medical Decision Making: Summary:  ?Patient presents emergency department with seasonal allergy type symptoms.  She has watery eyes, itchy nose and congestion.  Lungs are clear with normal oxygen saturation.  Considered chest x-ray and other lab work-up but seems most consistent with seasonal allergies.  No distress.  Plan for symptom management and PCP follow-up.  ? ?Disposition: discharge ? ?____________________________________________ ? ?FINAL CLINICAL IMPRESSION(S) / ED DIAGNOSES ? ?Final diagnoses:  ?Seasonal allergies  ? ? ? ?NEW OUTPATIENT MEDICATIONS STARTED DURING THIS VISIT: ? ?Discharge Medication List as of 12/16/2021  8:33 AM  ?  ? ?START taking these medications  ? Details  ?fluticasone (FLONASE) 50 MCG/ACT nasal spray Place 2 sprays into both nostrils daily for 7 days., Starting Fri 12/16/2021, Until Fri 12/23/2021, Normal  ?  ?levocetirizine (XYZAL ALLERGY 24HR) 5 MG tablet Take 1 tablet (5 mg total) by mouth every evening., Starting Fri 12/16/2021, Normal  ?  ?  ? ? ?Note:  This document was prepared using Dragon voice recognition software and may include unintentional dictation errors. ? ?Nanda Quinton, MD, FACEP ?Emergency Medicine ? ?  ?Margette Fast, MD ?12/16/21 1031 ? ?

## 2022-05-25 ENCOUNTER — Ambulatory Visit (HOSPITAL_COMMUNITY)
Admission: EM | Admit: 2022-05-25 | Discharge: 2022-05-25 | Disposition: A | Discharge status: Home / Self Care | Payer: BC Managed Care – PPO | Attending: Emergency Medicine | Admitting: Emergency Medicine

## 2022-05-25 ENCOUNTER — Encounter (HOSPITAL_COMMUNITY): Payer: Self-pay | Admitting: Emergency Medicine

## 2022-05-25 ENCOUNTER — Other Ambulatory Visit: Payer: Self-pay

## 2022-05-25 DIAGNOSIS — R519 Headache, unspecified: Secondary | ICD-10-CM | POA: Insufficient documentation

## 2022-05-25 DIAGNOSIS — Z20822 Contact with and (suspected) exposure to covid-19: Secondary | ICD-10-CM | POA: Insufficient documentation

## 2022-05-25 DIAGNOSIS — B349 Viral infection, unspecified: Secondary | ICD-10-CM | POA: Insufficient documentation

## 2022-05-25 DIAGNOSIS — R112 Nausea with vomiting, unspecified: Secondary | ICD-10-CM | POA: Insufficient documentation

## 2022-05-25 DIAGNOSIS — R1084 Generalized abdominal pain: Secondary | ICD-10-CM | POA: Insufficient documentation

## 2022-05-25 LAB — POCT URINALYSIS DIPSTICK, ED / UC
Bilirubin Urine: NEGATIVE
Glucose, UA: NEGATIVE mg/dL
Hgb urine dipstick: NEGATIVE
Ketones, ur: NEGATIVE mg/dL
Leukocytes,Ua: NEGATIVE
Nitrite: NEGATIVE
Protein, ur: NEGATIVE mg/dL
Specific Gravity, Urine: 1.02 (ref 1.005–1.030)
Urobilinogen, UA: 0.2 mg/dL (ref 0.0–1.0)
pH: 7 (ref 5.0–8.0)

## 2022-05-25 LAB — POC INFLUENZA A AND B ANTIGEN (URGENT CARE ONLY)
INFLUENZA A ANTIGEN, POC: NEGATIVE
INFLUENZA B ANTIGEN, POC: NEGATIVE

## 2022-05-25 MED ORDER — ONDANSETRON 4 MG PO TBDP
ORAL_TABLET | ORAL | Status: AC
  Filled 2022-05-25: qty 1

## 2022-05-25 MED ORDER — METOCLOPRAMIDE HCL 5 MG/ML IJ SOLN
INTRAMUSCULAR | Status: AC
  Filled 2022-05-25: qty 2

## 2022-05-25 MED ORDER — KETOROLAC TROMETHAMINE 30 MG/ML IJ SOLN
INTRAMUSCULAR | Status: AC
  Filled 2022-05-25: qty 1

## 2022-05-25 MED ORDER — KETOROLAC TROMETHAMINE 30 MG/ML IJ SOLN
30.0000 mg | Freq: Once | INTRAMUSCULAR | Status: AC
  Administered 2022-05-25: 30 mg via INTRAMUSCULAR

## 2022-05-25 MED ORDER — ONDANSETRON HCL 4 MG PO TABS: 4.0000 mg | ORAL_TABLET | Freq: Four times a day (QID) | ORAL | 0 refills | Status: DC | PRN

## 2022-05-25 MED ORDER — METOCLOPRAMIDE HCL 5 MG/ML IJ SOLN
5.0000 mg | Freq: Once | INTRAMUSCULAR | Status: AC
  Administered 2022-05-25: 5 mg via INTRAMUSCULAR

## 2022-05-25 MED ORDER — ONDANSETRON 4 MG PO TBDP
4.0000 mg | ORAL_TABLET | Freq: Once | ORAL | Status: AC
  Administered 2022-05-25: 4 mg via ORAL

## 2022-05-25 NOTE — ED Triage Notes (Signed)
Complains of headache.  Headache started Monday and gradually worsening.  Pain in forehead.  Abdominal cramping and vomiting.  Has been taking Pepto bismol.  Patient reports bouts of constipation, passing flatulence.  No diarrhea.  Hot and cold episodes.

## 2022-05-25 NOTE — ED Provider Notes (Signed)
[facility]    CSN: [id number] Arrival date & time: 05/25/22  7425      History   Chief Complaint Chief Complaint  Patient presents with   Headache    HPI Julia Price is a 31 y.o. female.  Presents with multiple symptoms that began Monday Reports headache started and has been gradually worsening, pain mostly in the front. 10/10 today.  Additionally chills, feeling hot and cold back-and-forth Abdominal cramping, couple episodes of vomiting with nausea.  No diarrhea. Reports constipation. Last BM 2 days ago Some discomfort with urination   No medications PTA  Denies nasal congestion, sore throat.  No known sick contacts No history of migraine  Hysterectomy 3 years ago  Past Medical History:  Diagnosis Date   Abnormal Pap smear of vagina    Arthritis    Intractable cyclical vomiting     Patient Active Problem List   Diagnosis Date Noted   DUB (dysfunctional uterine bleeding) 05/06/2019   Post-operative state 05/06/2019   Low grade squamous intraepith lesion on cytologic smear cervix (lgsil) 10/02/2018   Abnormal uterine bleeding (AUB) 04/28/2018   Acute bronchitis 07/27/2014   Vaginal pain 09/08/2013   Eczema 09/08/2013   Hematemesis 12/06/2011   Nipple problem 11/30/2011   Low back strain 06/19/2011   Sickle-cell trait (HCC) 01/29/2008   TOBACCO ABUSE, HX OF 01/29/2008    Past Surgical History:  Procedure Laterality Date   NO PAST SURGERIES     VAGINAL HYSTERECTOMY Bilateral 05/06/2019   Procedure: HYSTERECTOMY VAGINAL WITH SALPINGECTOMY;  Surgeon: Allie Bossier, MD;  Location: [facility] OR;  Service: Gynecology;  Laterality: Bilateral;    OB History     Gravida  1   Para  1   Term  1   Preterm      AB      Living  1      SAB      IAB      Ectopic      Multiple      Live Births  1            Home Medications    Prior to Admission medications   Medication Sig Start Date End Date Taking? Authorizing Provider   ondansetron (ZOFRAN) 4 MG tablet Take 1 tablet (4 mg total) by mouth every 6 (six) hours as needed for nausea or vomiting. 05/25/22  Yes Neshawn Aird, Lurena Joiner, PA-C  albuterol (VENTOLIN HFA) 108 (90 Base) MCG/ACT inhaler Inhale 1-2 puffs into the lungs every 6 (six) hours as needed. 12/16/21   Long, Arlyss Repress, MD  fluticasone (FLONASE) 50 MCG/ACT nasal spray Place 2 sprays into both nostrils daily for 7 days. 12/16/21 12/23/21  Long, Arlyss Repress, MD  ibuprofen (ADVIL) 800 MG tablet Take 1 tablet (800 mg total) by mouth every 8 (eight) hours. 05/07/19   Allie Bossier, MD  levocetirizine (XYZAL ALLERGY 24HR) 5 MG tablet Take 1 tablet (5 mg total) by mouth every evening. Patient not taking: Reported on 05/25/2022 12/16/21   Maia Plan, MD  Multiple Vitamin (MULTIVITAMIN WITH MINERALS) TABS tablet Take 1 tablet by mouth daily.    [provider]  oxyCODONE (OXY IR/ROXICODONE) 5 MG immediate release tablet Take 1-2 tablets (5-10 mg total) by mouth every 4 (four) hours as needed for moderate pain. Patient not taking: Reported on 06/06/2019 05/07/19   Allie Bossier, MD  promethazine-dextromethorphan (PROMETHAZINE-DM) 6.25-15 MG/5ML syrup Take 5 mLs by mouth 4 (four) times daily as needed for  cough. Patient not taking: Reported on 05/25/2022 09/21/20   Volney American, PA-C  Wild [place], Dioscorea villosa, (WILD YAM PO) Take 2 capsules by mouth daily.    [provider]    Family History Family History  Problem Relation Age of Onset   Diabetes Maternal Grandfather    Hypertension Maternal Grandfather     Social History Social History   Tobacco Use   Smoking status: Some Days    Packs/day: 1.00    Types: Cigarettes    Last attempt to quit: 08/28/2014    Years since quitting: 7.7   Smokeless tobacco: Never  Vaping Use   Vaping Use: Never used  Substance Use Topics   Alcohol use: Yes    Comment: occassionally   Drug use: Yes    Types: Marijuana    Comment: very occasional       Allergies   Latex   Review of Systems Review of Systems Per HPI  Physical Exam Triage Vital Signs ED Triage Vitals  Enc Vitals Group     BP 05/25/22 0906 94/63     Pulse Rate 05/25/22 0906 63     Resp 05/25/22 0906 20     Temp 05/25/22 0906 99.2 F (37.3 C)     Temp Source 05/25/22 0906 Oral     SpO2 05/25/22 0906 98 %     Weight --      Height --      Head Circumference --      Peak Flow --      Pain Score 05/25/22 0904 10     Pain Loc --      Pain Edu? --      Excl. in [place]? --    No data found.  Updated Vital Signs BP 94/63 (BP Location: Right Arm)   Pulse 63   Temp 99.2 F (37.3 C) (Oral)   Resp 20   LMP 04/18/2019   SpO2 98%    Physical Exam Vitals and nursing note reviewed.  Constitutional:      General: She is not in acute distress.    Comments: Laying in fetal position on exam table  HENT:     Mouth/Throat:     Mouth: Mucous membranes are moist.     Pharynx: Oropharynx is clear.  Eyes:     Conjunctiva/sclera: Conjunctivae normal.  Cardiovascular:     Rate and Rhythm: Normal rate and regular rhythm.     Pulses: Normal pulses.     Heart sounds: Normal heart sounds.  Pulmonary:     Effort: Pulmonary effort is normal. No respiratory distress.     Breath sounds: Normal breath sounds.  Abdominal:     Tenderness: There is abdominal tenderness. There is no guarding or rebound.     Comments: generalized   Musculoskeletal:        General: Normal range of motion.     Cervical back: Normal range of motion.  Skin:    General: Skin is warm and dry.  Neurological:     Mental Status: She is alert and oriented to person, place, and time.     UC Treatments / Results  Labs (all labs ordered are listed, but only abnormal results are displayed) Labs Reviewed  SARS CORONAVIRUS 2 (TAT 6-24 HRS)  POC INFLUENZA A AND B ANTIGEN (URGENT CARE ONLY)  POCT URINALYSIS DIPSTICK, ED / UC    EKG  Radiology No results found.  Procedures Procedures    Medications Ordered in UC Medications  ketorolac (TORADOL) 30 MG/ML injection 30 mg (30 mg Intramuscular Given 05/25/22 0951)  metoCLOPramide (REGLAN) injection 5 mg (5 mg Intramuscular Given 05/25/22 0953)  ondansetron (ZOFRAN-ODT) disintegrating tablet 4 mg (4 mg Oral Given 05/25/22 0950)    Initial Impression / Assessment and Plan / UC Course  I have reviewed the triage vital signs and the nursing notes.  Pertinent labs & imaging results that were available during my care of the patient were reviewed by me and considered in my medical decision making (see chart for details).  Toradol and Reglan injection given Zofran ODT Reports nausea has resolved, still feels some stomach cramping Could be constipation/gas, recommend to monitor symptoms Headache much better. After 30 min reports headache resolved.  Flu test negative. COVID test pending. Urinalysis negative.  Discussed symptomatic care Increase fluids, zofran PRN Strict return precautions, especially if abd pain persists or worsens. Patient agrees to plan  Final Clinical Impressions(s) / UC Diagnoses   Final diagnoses:  Viral illness  Generalized abdominal pain     Discharge Instructions      We will call you if your covid test returns positive. I recommend trying ibuprofen or tylenol for headache/body aches. You can take the zofran to help settle the stomach. Increase your fluid intake as much as tolerated.  Please return to the urgent care if symptoms do not improve, or go to the emergency department if symptoms worsen.     ED Prescriptions     Medication Sig Dispense Auth. Provider   ondansetron (ZOFRAN) 4 MG tablet Take 1 tablet (4 mg total) by mouth every 6 (six) hours as needed for nausea or vomiting. 12 tablet Jesica Goheen, Lurena Joiner, PA-C      PDMP not reviewed this encounter.   Desi Rowe, Lurena Joiner, [place] 05/25/22 1104

## 2022-05-25 NOTE — Discharge Instructions (Addendum)
We will call you if your covid test returns positive. I recommend trying ibuprofen or tylenol for headache/body aches. You can take the zofran to help settle the stomach. Increase your fluid intake as much as tolerated.  Please return to the urgent care if symptoms do not improve, or go to the emergency department if symptoms worsen.

## 2022-05-26 LAB — SARS CORONAVIRUS 2 (TAT 6-24 HRS): SARS Coronavirus 2: NEGATIVE

## 2022-08-01 ENCOUNTER — Emergency Department (HOSPITAL_BASED_OUTPATIENT_CLINIC_OR_DEPARTMENT_OTHER): Payer: BC Managed Care – PPO

## 2022-08-01 ENCOUNTER — Other Ambulatory Visit: Payer: Self-pay

## 2022-08-01 ENCOUNTER — Emergency Department (HOSPITAL_BASED_OUTPATIENT_CLINIC_OR_DEPARTMENT_OTHER)
Admission: EM | Admit: 2022-08-01 | Discharge: 2022-08-01 | Disposition: A | Discharge status: Home / Self Care | Payer: BC Managed Care – PPO | Attending: Emergency Medicine | Admitting: Emergency Medicine

## 2022-08-01 DIAGNOSIS — Z91040 Latex allergy status: Secondary | ICD-10-CM | POA: Insufficient documentation

## 2022-08-01 DIAGNOSIS — M25521 Pain in right elbow: Secondary | ICD-10-CM | POA: Insufficient documentation

## 2022-08-01 MED ORDER — HYDROCODONE-ACETAMINOPHEN 5-325 MG PO TABS: 1.0000 | ORAL_TABLET | Freq: Four times a day (QID) | ORAL | 0 refills | Status: AC | PRN

## 2022-08-01 MED ORDER — PREDNISONE 10 MG (21) PO TBPK: ORAL_TABLET | Freq: Every day | ORAL | 0 refills | Status: DC

## 2022-08-01 NOTE — Discharge Instructions (Addendum)
Return to the ED with any new or worsening signs or symptoms Please follow-up with Dr. Jordan Likes of sports medicine.  Please call and make an appointment to be seen. Please continue taking ibuprofen for pain.  Please begin taking steroid taper I placed you on. Please purchase a elbow brace as we discussed Please see attached work note and school note

## 2022-08-01 NOTE — ED Triage Notes (Signed)
Pt complains of right elbow pain started 8 months ago but got worse over past 3 weeks. Denies any injury. States it hurts to pick up things and radiates up to right shoulder.

## 2022-08-01 NOTE — ED Provider Notes (Signed)
[facility] EMERGENCY DEPARTMENT Provider Note   CSN: [id number] Arrival date & time: 08/01/22  1314     History  Chief Complaint  Patient presents with   Extremity Pain    Julia Price is a 31 y.o. female with medical history of arthritis.  Patient presents to ED for evaluation of right elbow pain.  The patient reports that beginning 8 months ago she developed right elbow pain that is progressively worsened since this time.  The patient reports that the pain is now shooting up her arm into her shoulder as well as down her hand into her wrist and her fourth and fifth fingers on the right hand.  Patient reports that she currently works at [employer] and is also in Engineer, mining.  Patient denies any event to account for this pain, denies any trauma to her elbow.  Patient reports he has been taking IcyHot, ibuprofen, Tylenol since this time that the pain began.  The patient states that none of these medications seem to be helping.  Patient denies any fevers, nausea or vomiting, overlying skin change of the elbow.   Extremity Pain       Home Medications Prior to Admission medications   Medication Sig Start Date End Date Taking? Authorizing Provider  HYDROcodone-acetaminophen (NORCO/VICODIN) 5-325 MG tablet Take 1 tablet by mouth every 6 (six) hours as needed. 08/01/22  Yes Azucena Cecil, PA-C  predniSONE (STERAPRED UNI-PAK 21 TAB) 10 MG (21) TBPK tablet Take by mouth daily. Take 6 tabs by mouth daily  for 2 days, then 5 tabs for 2 days, then 4 tabs for 2 days, then 3 tabs for 2 days, 2 tabs for 2 days, then 1 tab by mouth daily for 2 days 08/01/22  Yes Genevive Bi F, PA-C  albuterol (VENTOLIN HFA) 108 (90 Base) MCG/ACT inhaler Inhale 1-2 puffs into the lungs every 6 (six) hours as needed. 12/16/21   Long, Wonda Olds, MD  fluticasone (FLONASE) 50 MCG/ACT nasal spray Place 2 sprays into both nostrils daily for 7 days. 12/16/21 12/23/21  Long, Wonda Olds, MD  ibuprofen  (ADVIL) 800 MG tablet Take 1 tablet (800 mg total) by mouth every 8 (eight) hours. 05/07/19   [name], MD  levocetirizine (XYZAL ALLERGY 24HR) 5 MG tablet Take 1 tablet (5 mg total) by mouth every evening. Patient not taking: Reported on 05/25/2022 12/16/21   Margette Fast, MD  Multiple Vitamin (MULTIVITAMIN WITH MINERALS) TABS tablet Take 1 tablet by mouth daily.    [provider]  ondansetron (ZOFRAN) 4 MG tablet Take 1 tablet (4 mg total) by mouth every 6 (six) hours as needed for nausea or vomiting. 05/25/22   Rising, Wells Guiles, PA-C  oxyCODONE (OXY IR/ROXICODONE) 5 MG immediate release tablet Take 1-2 tablets (5-10 mg total) by mouth every 4 (four) hours as needed for moderate pain. Patient not taking: Reported on 06/06/2019 05/07/19   [name], MD  promethazine-dextromethorphan (PROMETHAZINE-DM) 6.25-15 MG/5ML syrup Take 5 mLs by mouth 4 (four) times daily as needed for cough. Patient not taking: Reported on 05/25/2022 09/21/20   Volney American, PA-C  Wild Goodrich, Dioscorea villosa, (WILD YAM PO) Take 2 capsules by mouth daily.    [provider]      Allergies    Latex    Review of Systems   Review of Systems  Musculoskeletal:  Positive for arthralgias.  All other systems reviewed and are negative.   Physical Exam Updated Vital Signs BP  108/66 (BP Location: Left Arm)   Pulse 68   Temp 99 F (37.2 C) (Oral)   Resp 18   Ht 5\' 3"  (1.6 m)   Wt 68 kg   LMP 04/18/2019   SpO2 97%   BMI 26.57 kg/m  Physical Exam Vitals and nursing note reviewed.  Constitutional:      General: She is not in acute distress.    Appearance: Normal appearance. She is not ill-appearing, toxic-appearing or diaphoretic.  HENT:     Head: Normocephalic and atraumatic.     Nose: Nose normal. No congestion.     Mouth/Throat:     Mouth: Mucous membranes are moist.     Pharynx: Oropharynx is clear.  Eyes:     Extraocular Movements: Extraocular movements intact.      Conjunctiva/sclera: Conjunctivae normal.     Pupils: Pupils are equal, round, and reactive to light.  Cardiovascular:     Rate and Rhythm: Normal rate and regular rhythm.  Pulmonary:     Effort: Pulmonary effort is normal.     Breath sounds: Normal breath sounds. No wheezing.  Abdominal:     General: Abdomen is flat. Bowel sounds are normal.     Palpations: Abdomen is soft.     Tenderness: There is no abdominal tenderness.  Musculoskeletal:     Right elbow: No swelling, deformity, effusion or lacerations. Normal range of motion. Tenderness present.     Cervical back: Normal range of motion and neck supple. No tenderness.     Comments: Patient right elbow has no overlying skin change.  The patient has appropriate range of motion to her right elbow.  Patient reports pain to the medial epicondyle.  Skin:    General: Skin is warm and dry.     Capillary Refill: Capillary refill takes less than 2 seconds.  Neurological:     Mental Status: She is alert and oriented to person, place, and time.     ED Results / Procedures / Treatments   Labs (all labs ordered are listed, but only abnormal results are displayed) Labs Reviewed - No data to display  EKG None  Radiology DG Elbow Complete Right  Result Date: 08/01/2022 CLINICAL DATA:  Pain EXAM: RIGHT ELBOW - COMPLETE 3+ VIEW COMPARISON:  None Available. FINDINGS: There is no evidence of fracture, dislocation, or joint effusion. There is no evidence of arthropathy or other focal bone abnormality. Soft tissues are unremarkable. IMPRESSION: No radiographic abnormalities are seen in right elbow. Electronically Signed   By: Elmer Picker M.D.   On: 08/01/2022 17:02   DG Shoulder Right  Result Date: 08/01/2022 CLINICAL DATA:  Worsening pain EXAM: RIGHT SHOULDER - 2+ VIEW COMPARISON:  None Available. FINDINGS: There is no evidence of fracture or dislocation. There is no evidence of arthropathy or other focal bone abnormality. Soft tissues are  unremarkable. IMPRESSION: No radiographic abnormalities are seen in right shoulder. Electronically Signed   By: Elmer Picker M.D.   On: 08/01/2022 16:46    Procedures Procedures   Medications Ordered in ED Medications - No data to display  ED Course/ Medical Decision Making/ A&P                           Medical Decision Making Amount and/or Complexity of Data Reviewed Radiology: ordered.   31 year old female presents to the ED for evaluation.  Please see HPI for further details.  On examination patient afebrile nontachycardic.  Patient sounds clear  bilaterally, she is not hypoxic.  Patient abdomen soft and compressible throughout.  Patient neurological examination shows no focal neurodeficits.  The patient right elbow has no overlying skin change, obvious swelling or deformity.  Patient has full range of motion of elbow.  Patient has pain to the medial epicondyle.  Patient grip strength intact right hand.  The patient is also complaining of pain up into her shoulder as well as down into her fourth and fifth fingers of the right hand.  Patient plain film imaging of right elbow, right shoulder shows no acute pathology or cause for pain.  Could be resulting of ulnar nerve entrapment however unsure at this time.  The patient will be placed on steroids, advised to purchase elbow brace, advised to continue taking ibuprofen and I will have this patient follow-up with sports medicine Dr. Clare Gandy.  The patient was provided return precautions and she voiced understanding.  The patient had all of her questions answered to her satisfaction.  The patient is stable for discharge.  Final Clinical Impression(s) / ED Diagnoses Final diagnoses:  Right elbow pain    Rx / DC Orders ED Discharge Orders          Ordered    predniSONE (STERAPRED UNI-PAK 21 TAB) 10 MG (21) TBPK tablet  Daily        08/01/22 1734    HYDROcodone-acetaminophen (NORCO/VICODIN) 5-325 MG tablet  Every 6 hours PRN         08/01/22 1734              Al Decant, PA-C 08/01/22 1736    Pricilla Loveless, MD 08/05/22 [phone]

## 2022-08-02 ENCOUNTER — Ambulatory Visit (INDEPENDENT_AMBULATORY_CARE_PROVIDER_SITE_OTHER): Payer: Self-pay | Admitting: Family Medicine

## 2022-08-02 ENCOUNTER — Encounter: Payer: Self-pay | Admitting: Family Medicine

## 2022-08-02 VITALS — BP 118/66 | Ht 63.0 in | Wt 150.0 lb

## 2022-08-02 DIAGNOSIS — M5412 Radiculopathy, cervical region: Secondary | ICD-10-CM | POA: Insufficient documentation

## 2022-08-02 DIAGNOSIS — G540 Brachial plexus disorders: Secondary | ICD-10-CM | POA: Insufficient documentation

## 2022-08-02 MED ORDER — GABAPENTIN 300 MG PO CAPS: 300.0000 mg | ORAL_CAPSULE | Freq: Three times a day (TID) | ORAL | 1 refills | Status: AC

## 2022-08-02 NOTE — Progress Notes (Signed)
  Julia Price - 31 y.o. female MRN [id number]  Date of birth: [birth year]  SUBJECTIVE:  Including CC & ROS.  No chief complaint on file.   Julia Price is a 31 y.o. female that is presenting with right arm pain.  The pain is acutely occurring and has gotten worse over the past 2 weeks.  She experiences pain at the neck and radiates to the fourth and fifth digit of the hand.  She is unable to find a good position.  Review of the emergency department note from 12/5 shows she was provided prednisone. Independent review of the right shoulder x-ray from 12/5 shows no acute changes. Independent review of the right elbow x-ray from 12/5 shows no acute changes.  Review of Systems See HPI   HISTORY: Past Medical, Surgical, Social, and Family History Reviewed & Updated per EMR.   Pertinent Historical Findings include:  Past Medical History:  Diagnosis Date   Abnormal Pap smear of vagina    Arthritis    Intractable cyclical vomiting     Past Surgical History:  Procedure Laterality Date   NO PAST SURGERIES     VAGINAL HYSTERECTOMY Bilateral 05/06/2019   Procedure: HYSTERECTOMY VAGINAL WITH SALPINGECTOMY;  Surgeon: Allie Bossier, MD;  Location: [facility] OR;  Service: Gynecology;  Laterality: Bilateral;     PHYSICAL EXAM:  VS: BP 118/66   Ht 5\' 3"  (1.6 m)   Wt 150 lb (68 kg)   LMP 04/18/2019   BMI 26.57 kg/m  Physical Exam Gen: NAD, alert, cooperative with exam, well-appearing MSK:  Neurovascularly intact       ASSESSMENT & PLAN:   Cervical radiculopathy Acutely occurring.  Having symptoms in a radicular pattern of the upper extremity.  Symptoms are fairly constant in nature. -Counseled on home exercise therapy and supportive care. -Counseled on prednisone and Norco. -Gabapentin. - provided work note  -Could set up further imaging, cervical soft collar or physical therapy.

## 2022-08-02 NOTE — Assessment & Plan Note (Signed)
Acutely occurring.  Having symptoms in a radicular pattern of the upper extremity.  Symptoms are fairly constant in nature. -Counseled on home exercise therapy and supportive care. -Counseled on prednisone and Norco. -Gabapentin. - provided work note  -Could set up further imaging, cervical soft collar or physical therapy.

## 2022-08-02 NOTE — Patient Instructions (Signed)
Nice to meet you Please try heat  You can take the prednisone all at one time at breakfast  Please try the exercises  Please use the pain medicine as needed  Please start the gabapentin at night. You can increase to 2 or 3 times daily.   Please send me a message in MyChart with any questions or updates.  Please see me back in 1-2 weeks.   --Dr. Jordan Likes

## 2022-08-16 ENCOUNTER — Encounter: Payer: Self-pay | Admitting: Family Medicine

## 2022-08-16 ENCOUNTER — Ambulatory Visit (INDEPENDENT_AMBULATORY_CARE_PROVIDER_SITE_OTHER): Payer: Self-pay | Admitting: Family Medicine

## 2022-08-16 ENCOUNTER — Ambulatory Visit (HOSPITAL_BASED_OUTPATIENT_CLINIC_OR_DEPARTMENT_OTHER)
Admission: RE | Admit: 2022-08-16 | Discharge: 2022-08-16 | Disposition: A | Discharge status: Home / Self Care | Payer: Self-pay | Source: Ambulatory Visit | Attending: Family Medicine | Admitting: Family Medicine

## 2022-08-16 VITALS — BP 96/62 | Ht 63.0 in | Wt 150.0 lb

## 2022-08-16 DIAGNOSIS — M5412 Radiculopathy, cervical region: Secondary | ICD-10-CM | POA: Insufficient documentation

## 2022-08-16 MED ORDER — METHOCARBAMOL 500 MG PO TABS: 500.0000 mg | ORAL_TABLET | Freq: Three times a day (TID) | ORAL | 1 refills | Status: AC | PRN

## 2022-08-16 NOTE — Patient Instructions (Signed)
Good to see you Please continue heat  Please continue the exercises  You can try using the gabapentin during the day as you tolerate  Please try the robaxin at night initially   Please send me a message in MyChart with any questions or updates.  Please see me back in 2-3 weeks.   --Dr. Jordan Likes

## 2022-08-16 NOTE — Progress Notes (Signed)
  Julia Price - 30 y.o. female MRN [id number]  Date of birth: March 20, 1991  SUBJECTIVE:  Including CC & ROS.  No chief complaint on file.   Julia Price is a 31 y.o. female that is following up for her right sided radicular pain.  She continues to have pain as a constant throb.  She cannot find a certain position to alleviate the pain   Review of Systems See HPI   HISTORY: Past Medical, Surgical, Social, and Family History Reviewed & Updated per EMR.   Pertinent Historical Findings include:  Past Medical History:  Diagnosis Date   Abnormal Pap smear of vagina    Arthritis    Intractable cyclical vomiting     Past Surgical History:  Procedure Laterality Date   NO PAST SURGERIES     VAGINAL HYSTERECTOMY Bilateral 05/06/2019   Procedure: HYSTERECTOMY VAGINAL WITH SALPINGECTOMY;  Surgeon: Allie Bossier, MD;  Location: [facility] OR;  Service: Gynecology;  Laterality: Bilateral;     PHYSICAL EXAM:  VS: BP 96/62 (BP Location: Left Arm, Patient Position: Sitting)   Ht 5\' 3"  (1.6 m)   Wt 150 lb (68 kg)   LMP 04/18/2019   BMI 26.57 kg/m  Physical Exam Gen: NAD, alert, cooperative with exam, well-appearing MSK:  Neurovascularly intact       ASSESSMENT & PLAN:   Cervical radiculopathy Acutely ongoing.  Limited improvement with therapies thus far.  Continues to have pain at the neck that radiates down the right arm.  Does notice a significant pain around the elbow -Counseled on home exercise therapy and supportive care. -Added Robaxin. -X-ray. -Could consider further imaging or physical therapy. -Provided work note

## 2022-08-16 NOTE — Assessment & Plan Note (Signed)
Acutely ongoing.  Limited improvement with therapies thus far.  Continues to have pain at the neck that radiates down the right arm.  Does notice a significant pain around the elbow -Counseled on home exercise therapy and supportive care. -Added Robaxin. -X-ray. -Could consider further imaging or physical therapy. -Provided work note

## 2022-09-04 ENCOUNTER — Ambulatory Visit (INDEPENDENT_AMBULATORY_CARE_PROVIDER_SITE_OTHER): Payer: BC Managed Care – PPO | Admitting: Family Medicine

## 2022-09-04 VITALS — BP 110/70 | Ht 63.0 in | Wt 150.0 lb

## 2022-09-04 DIAGNOSIS — M5412 Radiculopathy, cervical region: Secondary | ICD-10-CM | POA: Diagnosis not present

## 2022-09-04 NOTE — Patient Instructions (Signed)
Good to see you Please continue heat  Please try the exercises  We'll get the MRI at [facility]  Please send me a message in MyChart with any questions or updates.  We'll setup a virtual visit once the MRI is resulted.   --Dr. Raeford Razor

## 2022-09-04 NOTE — Progress Notes (Signed)
  Julia Price - 32 y.o. female MRN [id number]  Date of birth: 07-19-1991  SUBJECTIVE:  Including CC & ROS.  No chief complaint on file.   Julia Price is a 32 y.o. female that is  presenting with acute worsening of her right radicular pain. Pain is worsening. She is having worsening pain and altered sensation in the right arm. Pain is constant. No improvement with medications.    Review of Systems See HPI   HISTORY: Past Medical, Surgical, Social, and Family History Reviewed & Updated per EMR.   Pertinent Historical Findings include:  Past Medical History:  Diagnosis Date   Abnormal Pap smear of vagina    Arthritis    Intractable cyclical vomiting     Past Surgical History:  Procedure Laterality Date   NO PAST SURGERIES     VAGINAL HYSTERECTOMY Bilateral 05/06/2019   Procedure: HYSTERECTOMY VAGINAL WITH SALPINGECTOMY;  Surgeon: [name], MD;  Location: [facility];  Service: Gynecology;  Laterality: Bilateral;     PHYSICAL EXAM:  VS: BP 110/70   Ht 5\' 3"  (1.6 m)   Wt 150 lb (68 kg)   LMP 04/18/2019   BMI 26.57 kg/m  Physical Exam Gen: NAD, alert, cooperative with exam, well-appearing MSK:  Neck:  Limited lateral rotation to the right. Normal flexion extension. Weakness with resistance to right shoulder flexion and extension. Diminished deep tendon reflexes of the biceps and tricep. Weakened grip strength. Trouble with pincer grasp Neurovascularly intact       ASSESSMENT & PLAN:   Cervical radiculopathy Acutely worsening of her pain despite conservative measures thus far.  Imaging has been unrevealing.  Now having weakness with grip and diminished deep tendon reflexes. -Counseled on home exercise therapy and supportive care. -MRI of the cervical spine to evaluate for disc herniation and nerve impingement and further consideration of epidural use.

## 2022-09-04 NOTE — Assessment & Plan Note (Signed)
Acutely worsening of her pain despite conservative measures thus far.  Imaging has been unrevealing.  Now having weakness with grip and diminished deep tendon reflexes. -Counseled on home exercise therapy and supportive care. -MRI of the cervical spine to evaluate for disc herniation and nerve impingement and further consideration of epidural use.

## 2022-09-12 ENCOUNTER — Ambulatory Visit (INDEPENDENT_AMBULATORY_CARE_PROVIDER_SITE_OTHER): Payer: BC Managed Care – PPO

## 2022-09-12 ENCOUNTER — Encounter: Payer: Self-pay | Admitting: Family Medicine

## 2022-09-12 DIAGNOSIS — M5412 Radiculopathy, cervical region: Secondary | ICD-10-CM | POA: Diagnosis not present

## 2022-09-13 ENCOUNTER — Encounter: Payer: Self-pay | Admitting: Family Medicine

## 2022-09-13 ENCOUNTER — Telehealth (INDEPENDENT_AMBULATORY_CARE_PROVIDER_SITE_OTHER): Payer: BC Managed Care – PPO | Admitting: Family Medicine

## 2022-09-13 VITALS — Ht 63.0 in | Wt 150.0 lb

## 2022-09-13 DIAGNOSIS — G540 Brachial plexus disorders: Secondary | ICD-10-CM | POA: Diagnosis not present

## 2022-09-13 NOTE — Assessment & Plan Note (Signed)
MRi of the cervical spine shows no abnormalities. Continues to have pain down the right arm.  - counseled on home exercise therapy and supportive care - pursue EMG.

## 2022-09-13 NOTE — Progress Notes (Signed)
Virtual Visit via Video Note  I connected with Julia Price on 09/13/22 at  1:10 PM EST by a video enabled telemedicine application and verified that I am speaking with the correct person using two identifiers.  Location: Patient: vehicle  Provider: office   I discussed the limitations of evaluation and management by telemedicine and the availability of in person appointments. The patient expressed understanding and agreed to proceed.  History of Present Illness:  Julia Price is following up for her MRI cervical spine. She continues to have pain down the right arm and weakness in the grip strength. There were no abnormalities on the imaging.    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 Continues to have pain down the right arm.  - counseled on home exercise therapy and supportive care - pursue EMG.   Follow Up Instructions:    I discussed the assessment and treatment plan with the patient. The patient was provided an opportunity to ask questions and all were answered. The patient agreed with the plan and demonstrated an understanding of the instructions.   The patient was advised to call back or seek an in-person evaluation if the symptoms worsen or if the condition fails to improve as anticipated.   Clearance Coots, MD

## 2022-09-14 NOTE — Addendum Note (Signed)
Addended by: Cresenciano Lick on: 09/14/2022 02:12 PM   Modules accepted: Orders

## 2022-11-22 ENCOUNTER — Ambulatory Visit (HOSPITAL_COMMUNITY)
Admission: EM | Admit: 2022-11-22 | Discharge: 2022-11-22 | Disposition: A | Discharge status: Home / Self Care | Payer: Self-pay | Attending: Emergency Medicine | Admitting: Emergency Medicine

## 2022-11-22 ENCOUNTER — Encounter (HOSPITAL_COMMUNITY): Payer: Self-pay

## 2022-11-22 ENCOUNTER — Ambulatory Visit (INDEPENDENT_AMBULATORY_CARE_PROVIDER_SITE_OTHER): Payer: Self-pay

## 2022-11-22 DIAGNOSIS — R059 Cough, unspecified: Secondary | ICD-10-CM

## 2022-11-22 DIAGNOSIS — J329 Chronic sinusitis, unspecified: Secondary | ICD-10-CM

## 2022-11-22 DIAGNOSIS — J45901 Unspecified asthma with (acute) exacerbation: Secondary | ICD-10-CM

## 2022-11-22 DIAGNOSIS — R0602 Shortness of breath: Secondary | ICD-10-CM

## 2022-11-22 DIAGNOSIS — B9689 Other specified bacterial agents as the cause of diseases classified elsewhere: Secondary | ICD-10-CM

## 2022-11-22 MED ORDER — AMOXICILLIN-POT CLAVULANATE 875-125 MG PO TABS: 1.0000 | ORAL_TABLET | Freq: Two times a day (BID) | ORAL | 0 refills | Status: AC

## 2022-11-22 MED ORDER — ALBUTEROL SULFATE HFA 108 (90 BASE) MCG/ACT IN AERS: 1.0000 | INHALATION_SPRAY | Freq: Four times a day (QID) | RESPIRATORY_TRACT | 0 refills | Status: AC | PRN

## 2022-11-22 NOTE — ED Provider Notes (Signed)
[place]    CSN: [id number] Arrival date & time: 11/22/22  1137      History   Chief Complaint Chief Complaint  Patient presents with   Cough   Sinus Problem    HPI Julia Price is a 32 y.o. female.   Presents to clinic for cough, fatigue, shortness of breath, wheezing and sinus pain and pressure since last Saturday. Denies fever, hot or cold chills. Has been taking Mucinex, Tylenol severe, Benadryl and ibuprofen without much relief.  She does have a history of asthma, reports smoking marijuana through a bowl a few times per week, has not smoked recently due to her shortness of breath. She has been using her inhaler more often and is almost out of her albuterol.   Negative Covid-19 test yesterday.     The history is provided by the patient and medical records.  Cough Associated symptoms: shortness of breath, sore throat and wheezing   Associated symptoms: no chest pain, no chills, no eye discharge and no fever   Sinus Problem Associated symptoms include shortness of breath. Pertinent negatives include no chest pain and no abdominal pain.    Past Medical History:  Diagnosis Date   Abnormal Pap smear of vagina    Arthritis    Intractable cyclical vomiting     Patient Active Problem List   Diagnosis Date Noted   Brachial plexopathy 08/02/2022   DUB (dysfunctional uterine bleeding) 05/06/2019   Post-operative state 05/06/2019   Low grade squamous intraepith lesion on cytologic smear cervix (lgsil) 10/02/2018   Abnormal uterine bleeding (AUB) 04/28/2018   Acute bronchitis 07/27/2014   Vaginal pain 09/08/2013   Eczema 09/08/2013   Hematemesis 12/06/2011   Nipple problem 11/30/2011   Low back strain 06/19/2011   Sickle-cell trait ([place]) 01/29/2008   TOBACCO ABUSE, HX OF 01/29/2008    Past Surgical History:  Procedure Laterality Date   NO PAST SURGERIES     VAGINAL HYSTERECTOMY Bilateral 05/06/2019   Procedure: HYSTERECTOMY VAGINAL WITH  SALPINGECTOMY;  Surgeon: [name], MD;  Location: [facility];  Service: Gynecology;  Laterality: Bilateral;    OB History     Gravida  1   Para  1   Term  1   Preterm      AB      Living  1      SAB      IAB      Ectopic      Multiple      Live Births  1            Home Medications    Prior to Admission medications   Medication Sig Start Date End Date Taking? Authorizing Provider  albuterol (VENTOLIN HFA) 108 (90 Base) MCG/ACT inhaler Inhale 1-2 puffs into the lungs every 6 (six) hours as needed for wheezing or shortness of breath. 11/22/22  Yes Louretta Shorten, [place] N, FNP  amoxicillin-clavulanate (AUGMENTIN) 875-125 MG tablet Take 1 tablet by mouth every 12 (twelve) hours. 11/22/22  Yes Louretta Shorten, [place] N, FNP  ibuprofen (ADVIL) 800 MG tablet Take 1 tablet (800 mg total) by mouth every 8 (eight) hours. 05/07/19  Yes Dove, Myra C, MD  fluticasone (FLONASE) 50 MCG/ACT nasal spray Place 2 sprays into both nostrils daily for 7 days. 12/16/21 12/23/21  Long, Wonda Olds, MD  gabapentin (NEURONTIN) 300 MG capsule Take 1 capsule (300 mg total) by mouth 3 (three) times daily. 08/02/22   Rosemarie Ax, MD  HYDROcodone-acetaminophen (NORCO/VICODIN) [phone]  MG tablet Take 1 tablet by mouth every 6 (six) hours as needed. 08/01/22   Azucena Cecil, PA-C  methocarbamol (ROBAXIN) 500 MG tablet Take 1 tablet (500 mg total) by mouth 3 (three) times daily as needed for muscle spasms. 08/16/22   Rosemarie Ax, MD    Family History Family History  Problem Relation Age of Onset   Diabetes Maternal Grandfather    Hypertension Maternal Grandfather     Social History Social History   Tobacco Use   Smoking status: Some Days    Packs/day: 1    Types: Cigarettes    Last attempt to quit: 08/28/2014    Years since quitting: 8.2   Smokeless tobacco: Never  Vaping Use   Vaping Use: Never used  Substance Use Topics   Alcohol use: Yes    Comment: occassionally   Drug use: Yes     Types: Marijuana    Comment: very occasional      Allergies   Latex   Review of Systems Review of Systems  Constitutional:  Positive for fatigue. Negative for chills and fever.  HENT:  Positive for congestion, postnasal drip, sinus pressure, sinus pain and sore throat.   Eyes:  Negative for discharge.  Respiratory:  Positive for cough, shortness of breath and wheezing.   Cardiovascular:  Negative for chest pain.  Gastrointestinal:  Positive for nausea. Negative for abdominal pain, constipation, diarrhea and vomiting.  Genitourinary:  Negative for dysuria.  Musculoskeletal:  Negative for back pain.     Physical Exam Triage Vital Signs ED Triage Vitals  Enc Vitals Group     BP 11/22/22 1228 105/72     Pulse Rate 11/22/22 1228 67     Resp 11/22/22 1228 18     Temp 11/22/22 1228 98.6 F (37 C)     Temp Source 11/22/22 1228 Oral     SpO2 11/22/22 1228 99 %     Weight 11/22/22 1228 154 lb (69.9 kg)     Height 11/22/22 1228 5\' 3"  (1.6 m)     Head Circumference --      Peak Flow --      Pain Score 11/22/22 1226 8     Pain Loc --      Pain Edu? --      Excl. in [place]? --    No data found.  Updated Vital Signs BP 105/72 (BP Location: Right Arm)   Pulse 67   Temp 98.6 F (37 C) (Oral)   Resp 18   Ht 5\' 3"  (1.6 m)   Wt 154 lb (69.9 kg)   LMP 04/18/2019   SpO2 99%   BMI 27.28 kg/m   Visual Acuity Right Eye Distance:   Left Eye Distance:   Bilateral Distance:    Right Eye Near:   Left Eye Near:    Bilateral Near:     Physical Exam Vitals and nursing note reviewed.  Constitutional:      General: She is not in acute distress.    Appearance: She is well-developed.  HENT:     Head: Normocephalic and atraumatic.     Right Ear: External ear normal.     Left Ear: External ear normal.     Nose: Congestion and rhinorrhea present.     Mouth/Throat:     Mouth: Mucous membranes are moist.     Pharynx: Posterior oropharyngeal erythema present.  Eyes:     General: No  scleral icterus.       Right  eye: No discharge.        Left eye: No discharge.     Conjunctiva/sclera: Conjunctivae normal.     Pupils: Pupils are equal, round, and reactive to light.  Cardiovascular:     Rate and Rhythm: Normal rate and regular rhythm.     Pulses: Normal pulses.     Heart sounds: S1 normal and S2 normal. No murmur heard. Pulmonary:     Effort: Pulmonary effort is normal. No respiratory distress.     Breath sounds: Normal breath sounds.     Comments: Lungs vesicular posteriorly. Musculoskeletal:        General: No swelling. Normal range of motion.     Cervical back: Normal range of motion and neck supple.  Lymphadenopathy:     Cervical: Cervical adenopathy present.  Skin:    General: Skin is warm and dry.     Capillary Refill: Capillary refill takes less than 2 seconds.  Neurological:     Mental Status: She is alert.  Psychiatric:        Mood and Affect: Mood normal.        Behavior: Behavior is cooperative.      UC Treatments / Results  Labs (all labs ordered are listed, but only abnormal results are displayed) Labs Reviewed - No data to display  EKG   Radiology DG Chest 2 View  Result Date: 11/22/2022 CLINICAL DATA:  Cough, shortness breath EXAM: CHEST - 2 VIEW COMPARISON:  None Available. FINDINGS: Evaluation on the PA view is somewhat limited by material that is external to the patient. Within this limitation, normal cardiac and mediastinal contours. No focal pulmonary opacity. No pleural effusion or pneumothorax. No acute osseous abnormality. IMPRESSION: No acute cardiopulmonary process. Electronically Signed   By: Merilyn Baba M.D.   On: 11/22/2022 13:25    Procedures Procedures (including critical care time)  Medications Ordered in UC Medications - No data to display  Initial Impression / Assessment and Plan / UC Course  I have reviewed the triage vital signs and the nursing notes.  Pertinent labs & imaging results that were available  during my care of the patient were reviewed by me and considered in my medical decision making (see chart for details).  Vitals in triage reviewed, patient is hemodynamically stable.  Lungs vesicular on exam with 99% oxygenation on room air at rest.  Due to subjective shortness of breath and history of asthma, chest x-ray obtained.  Imaging negative for acute cardiopulmonary process. Due to frontal and maxillary sinus pain on palpation, sinus pressure and headache, will cover w/ Augmentin for ABS.  Will refill inhaler to use as needed for wheezing or shortness of breath.  Suspect mild asthma exacerbation.  Plan of care discussed with patient, return precautions and follow-up care reviewed, no questions at this time.    Final Clinical Impressions(s) / UC Diagnoses   Final diagnoses:  Bacterial sinusitis  Mild asthma exacerbation     Discharge Instructions      I am covering you with Augmentin for bacterial sinus infection.  Please take all antibiotics as prescribed.  He can take them with food to help prevent gastrointestinal upset.  I suspect you are also having a mild asthma exacerbation, please continue to use your inhaler every 6 hours as needed for wheezing or shortness of breath.  Please ensure you are taking 1200 mg of Mucinex and drinking 64 ounces of water to help loosen your secretions.  You can also sleep with a humidifier  to help with your wheezing or shortness of breath.  You can call [facility] to establish a primary care provider.  Please return to clinic if no improvement after antibiotics, you develop worsening shortness of breath, chest pain or any changes in condition.      ED Prescriptions     Medication Sig Dispense Auth. Provider   albuterol (VENTOLIN HFA) 108 (90 Base) MCG/ACT inhaler Inhale 1-2 puffs into the lungs every 6 (six) hours as needed for wheezing or shortness of breath. 18 g Ermie Glendenning, [place] N, FNP    amoxicillin-clavulanate (AUGMENTIN) 875-125 MG tablet Take 1 tablet by mouth every 12 (twelve) hours. 14 tablet Mirella Gueye, [place] N, [place]      PDMP not reviewed this encounter.   Naijah Lacek, [place] N, [place] 11/22/22 [phone]

## 2022-11-22 NOTE — ED Triage Notes (Signed)
Onset Saturday with sore throat/burning, sinus pain, headache, nausea, and dry cough. [facility] Covid test yesterday negative, No known sick exposure.  Patient tried Mucinex, tylenol sever, benadryl, and ibuprofen with no relief.

## 2022-11-22 NOTE — Discharge Instructions (Signed)
I am covering you with Augmentin for bacterial sinus infection.  Please take all antibiotics as prescribed.  He can take them with food to help prevent gastrointestinal upset.  I suspect you are also having a mild asthma exacerbation, please continue to use your inhaler every 6 hours as needed for wheezing or shortness of breath.  Please ensure you are taking 1200 mg of Mucinex and drinking 64 ounces of water to help loosen your secretions.  You can also sleep with a humidifier to help with your wheezing or shortness of breath.  You can call [place] [facility] to establish a primary care provider.  Please return to clinic if no improvement after antibiotics, you develop worsening shortness of breath, chest pain or any changes in condition.

## 2022-12-11 ENCOUNTER — Encounter: Payer: Self-pay | Admitting: *Deleted

## 2023-07-15 ENCOUNTER — Emergency Department (HOSPITAL_COMMUNITY)
Admission: EM | Admit: 2023-07-15 | Discharge: 2023-07-15 | Disposition: A | Discharge status: Home / Self Care | Payer: Managed Care, Other (non HMO) | Attending: Emergency Medicine | Admitting: Emergency Medicine

## 2023-07-15 ENCOUNTER — Encounter (HOSPITAL_COMMUNITY): Payer: Self-pay | Admitting: Emergency Medicine

## 2023-07-15 DIAGNOSIS — T23202A Burn of second degree of left hand, unspecified site, initial encounter: Secondary | ICD-10-CM | POA: Diagnosis present

## 2023-07-15 DIAGNOSIS — Z23 Encounter for immunization: Secondary | ICD-10-CM | POA: Insufficient documentation

## 2023-07-15 DIAGNOSIS — X18XXXA Contact with other hot metals, initial encounter: Secondary | ICD-10-CM | POA: Diagnosis not present

## 2023-07-15 DIAGNOSIS — T23222A Burn of second degree of single left finger (nail) except thumb, initial encounter: Secondary | ICD-10-CM

## 2023-07-15 DIAGNOSIS — Z91040 Latex allergy status: Secondary | ICD-10-CM | POA: Insufficient documentation

## 2023-07-15 MED ORDER — OXYCODONE-ACETAMINOPHEN 5-325 MG PO TABS
1.0000 | ORAL_TABLET | Freq: Once | ORAL | Status: AC
  Administered 2023-07-15: 1 via ORAL
  Filled 2023-07-15: qty 1

## 2023-07-15 MED ORDER — TETANUS-DIPHTH-ACELL PERTUSSIS 5-2.5-18.5 LF-MCG/0.5 IM SUSY
0.5000 mL | PREFILLED_SYRINGE | Freq: Once | INTRAMUSCULAR | Status: AC
  Administered 2023-07-15: 0.5 mL via INTRAMUSCULAR
  Filled 2023-07-15: qty 0.5

## 2023-07-15 MED ORDER — HYDROMORPHONE HCL 1 MG/ML IJ SOLN
1.0000 mg | Freq: Once | INTRAMUSCULAR | Status: AC
  Administered 2023-07-15: 1 mg via SUBCUTANEOUS
  Filled 2023-07-15: qty 1

## 2023-07-15 MED ORDER — SILVER SULFADIAZINE 1 % EX CREA
TOPICAL_CREAM | Freq: Once | CUTANEOUS | Status: AC
  Filled 2023-07-15: qty 85

## 2023-07-15 MED ORDER — OXYCODONE-ACETAMINOPHEN 5-325 MG PO TABS: 1.0000 | ORAL_TABLET | Freq: Four times a day (QID) | ORAL | 0 refills | Status: AC | PRN

## 2023-07-15 MED ORDER — OXYCODONE-ACETAMINOPHEN 5-325 MG PO TABS
1.0000 | ORAL_TABLET | Freq: Once | ORAL | Status: DC
  Filled 2023-07-15: qty 1

## 2023-07-15 MED ORDER — CEPHALEXIN 500 MG PO CAPS: 500.0000 mg | ORAL_CAPSULE | Freq: Two times a day (BID) | ORAL | 0 refills | Status: DC

## 2023-07-15 MED ORDER — CEPHALEXIN 250 MG PO CAPS
500.0000 mg | ORAL_CAPSULE | Freq: Once | ORAL | Status: AC
  Administered 2023-07-15: 500 mg via ORAL
  Filled 2023-07-15: qty 2

## 2023-07-15 NOTE — ED Notes (Signed)
Patient verbalizes understanding of discharge instructions. Opportunity for questioning and answers were provided. Armband removed by staff, pt discharged from ED. Ambulated out to lobby  
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
## 2023-07-18 ENCOUNTER — Other Ambulatory Visit: Payer: Self-pay

## 2023-07-18 ENCOUNTER — Other Ambulatory Visit (HOSPITAL_BASED_OUTPATIENT_CLINIC_OR_DEPARTMENT_OTHER): Payer: Self-pay

## 2023-07-18 ENCOUNTER — Encounter (HOSPITAL_BASED_OUTPATIENT_CLINIC_OR_DEPARTMENT_OTHER): Payer: Self-pay | Admitting: Emergency Medicine

## 2023-07-18 ENCOUNTER — Ambulatory Visit
Admission: EM | Admit: 2023-07-18 | Discharge: 2023-07-18 | Disposition: A | Discharge status: Other Acute Inpatient Hospital | Payer: Managed Care, Other (non HMO)

## 2023-07-18 ENCOUNTER — Emergency Department (HOSPITAL_BASED_OUTPATIENT_CLINIC_OR_DEPARTMENT_OTHER)
Admission: EM | Admit: 2023-07-18 | Discharge: 2023-07-18 | Disposition: A | Discharge status: Home / Self Care | Payer: Managed Care, Other (non HMO) | Attending: Emergency Medicine | Admitting: Emergency Medicine

## 2023-07-18 DIAGNOSIS — T23221A Burn of second degree of single right finger (nail) except thumb, initial encounter: Secondary | ICD-10-CM

## 2023-07-18 DIAGNOSIS — R509 Fever, unspecified: Secondary | ICD-10-CM | POA: Diagnosis not present

## 2023-07-18 DIAGNOSIS — R110 Nausea: Secondary | ICD-10-CM | POA: Insufficient documentation

## 2023-07-18 DIAGNOSIS — R5381 Other malaise: Secondary | ICD-10-CM | POA: Diagnosis not present

## 2023-07-18 DIAGNOSIS — Z91040 Latex allergy status: Secondary | ICD-10-CM | POA: Diagnosis not present

## 2023-07-18 MED ORDER — ONDANSETRON 8 MG PO TBDP
8.0000 mg | ORAL_TABLET | Freq: Three times a day (TID) | ORAL | 0 refills | Status: DC | PRN
  Filled 2023-07-18: qty 20, 6d supply, fill #0
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
## 2023-07-18 NOTE — Discharge Instructions (Signed)
Please go to the ER as soon as you leave urgent care for further evaluation and management.  
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
## 2023-07-18 NOTE — Discharge Instructions (Addendum)
Stop keflex Use zofran as needed.
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
## 2023-09-27 ENCOUNTER — Encounter: Payer: Self-pay | Admitting: Family Medicine

## 2023-09-27 ENCOUNTER — Telehealth: Payer: Self-pay | Admitting: Family Medicine

## 2023-09-27 ENCOUNTER — Ambulatory Visit (INDEPENDENT_AMBULATORY_CARE_PROVIDER_SITE_OTHER): Payer: Managed Care, Other (non HMO) | Admitting: Family Medicine

## 2023-09-27 VITALS — BP 127/73 | HR 79 | Temp 98.3°F | Resp 16 | Ht 63.0 in | Wt 140.2 lb

## 2023-09-27 DIAGNOSIS — Z0000 Encounter for general adult medical examination without abnormal findings: Secondary | ICD-10-CM

## 2023-09-27 DIAGNOSIS — Z130 Encounter for screening for diseases of the blood and blood-forming organs and certain disorders involving the immune mechanism: Secondary | ICD-10-CM

## 2023-09-27 DIAGNOSIS — Z13220 Encounter for screening for lipoid disorders: Secondary | ICD-10-CM

## 2023-09-27 DIAGNOSIS — Z7689 Persons encountering health services in other specified circumstances: Secondary | ICD-10-CM

## 2023-09-27 NOTE — Telephone Encounter (Signed)
Pt stated she does not mind starting the medication and the counselor

## 2023-09-27 NOTE — Progress Notes (Signed)
New Patient Office Visit  Subjective    Patient ID: Julia Price, female    DOB: 1991/05/30  Age: 33 y.o. MRN: [id number]  CC:  Chief Complaint  Patient presents with   Establish Care    HPI Julia Price presents to establish care and for routine annual exam. Patient reports that she ahs been having extreme social stressors.   Outpatient Encounter Medications as of 09/27/2023  Medication Sig   albuterol (VENTOLIN HFA) 108 (90 Base) MCG/ACT inhaler Inhale 1-2 puffs into the lungs every 6 (six) hours as needed for wheezing or shortness of breath.   gabapentin (NEURONTIN) 300 MG capsule Take 1 capsule (300 mg total) by mouth 3 (three) times daily.   ibuprofen (ADVIL) 800 MG tablet Take 1 tablet (800 mg total) by mouth every 8 (eight) hours.   ondansetron (ZOFRAN-ODT) 8 MG disintegrating tablet Take 1 tablet (8 mg total) by mouth every 8 (eight) hours as needed for nausea or vomiting.   amoxicillin-clavulanate (AUGMENTIN) 875-125 MG tablet Take 1 tablet by mouth every 12 (twelve) hours. (Patient not taking: Reported on 09/27/2023)   fluticasone (FLONASE) 50 MCG/ACT nasal spray Place 2 sprays into both nostrils daily for 7 days.   HYDROcodone-acetaminophen (NORCO/VICODIN) 5-325 MG tablet Take 1 tablet by mouth every 6 (six) hours as needed. (Patient not taking: Reported on 09/27/2023)   methocarbamol (ROBAXIN) 500 MG tablet Take 1 tablet (500 mg total) by mouth 3 (three) times daily as needed for muscle spasms. (Patient not taking: Reported on 09/27/2023)   oxyCODONE-acetaminophen (PERCOCET/ROXICET) 5-325 MG tablet Take 1 tablet by mouth every 6 (six) hours as needed for severe pain (pain score 7-10). (Patient not taking: Reported on 09/27/2023)   No facility-administered encounter medications on file as of 09/27/2023.    Past Medical History:  Diagnosis Date   Abnormal Pap smear of vagina    Arthritis    Intractable cyclical vomiting     Past Surgical History:   Procedure Laterality Date   NO PAST SURGERIES     VAGINAL HYSTERECTOMY Bilateral 05/06/2019   Procedure: HYSTERECTOMY VAGINAL WITH SALPINGECTOMY;  Surgeon: Allie Bossier, MD;  Location: [facility] OR;  Service: Gynecology;  Laterality: Bilateral;    Family History  Problem Relation Age of Onset   Diabetes Maternal Grandfather    Hypertension Maternal Grandfather     Social History   Socioeconomic History   Marital status: Single    Spouse name: Not on file   Number of children: 1   Years of education: Not on file   Highest education level: Some college, no degree  Occupational History   Not on file  Tobacco Use   Smoking status: Some Days    Current packs/day: 0.00    Types: Cigarettes    Last attempt to quit: 08/28/2014    Years since quitting: 9.0   Smokeless tobacco: Never  Vaping Use   Vaping status: Never Used  Substance and Sexual Activity   Alcohol use: Yes    Comment: occassionally   Drug use: Yes    Types: Marijuana    Comment: very occasional    Sexual activity: Yes    Birth control/protection: None    Comment: with female  Other Topics Concern   Not on file  Social History Narrative   Not on file   Social Drivers of Health   Financial Resource Strain: Medium Risk (09/27/2023)   Overall Financial Resource Strain (CARDIA)    Difficulty of Paying Living Expenses: Somewhat  hard  Food Insecurity: Food Insecurity Present (09/27/2023)   Hunger Vital Sign    Worried About Running Out of Food in the Last Year: Often true    Ran Out of Food in the Last Year: Often true  Transportation Needs: No Transportation Needs (09/27/2023)   PRAPARE - Administrator, Civil Service (Medical): No    Lack of Transportation (Non-Medical): No  Physical Activity: Sufficiently Active (09/27/2023)   Exercise Vital Sign    Days of Exercise per Week: 5 days    Minutes of Exercise per Session: 30 min  Stress: Stress Concern Present (09/27/2023)   [employer] of Occupational  Health - Occupational Stress Questionnaire    Feeling of Stress : Very much  Social Connections: Socially Isolated (09/27/2023)   Social Connection and Isolation Panel [NHANES]    Frequency of Communication with Friends and Family: More than three times a week    Frequency of Social Gatherings with Friends and Family: More than three times a week    Attends Religious Services: Never    Database administrator or Organizations: No    Attends Banker Meetings: Never    Marital Status: Never married  Intimate Partner Violence: Not At Risk (09/27/2023)   Humiliation, Afraid, Rape, and Kick questionnaire    Fear of Current or Ex-Partner: No    Emotionally Abused: No    Physically Abused: No    Sexually Abused: No    Review of Systems  Psychiatric/Behavioral:  Negative for suicidal ideas. The patient is nervous/anxious.   All other systems reviewed and are negative.       Objective   BP 127/73 (BP Location: Left Arm, Patient Position: Sitting, Cuff Size: Normal)   Pulse 79   Temp 98.3 F (36.8 C) (Oral)   Resp 16   Ht 5\' 3"  (1.6 m)   Wt 140 lb 3.2 oz (63.6 kg)   LMP 04/18/2019   SpO2 97%   BMI 24.84 kg/m   Physical Exam Vitals and nursing note reviewed.  Constitutional:      General: She is not in acute distress. HENT:     Head: Normocephalic and atraumatic.     Right Ear: Tympanic membrane, ear canal and external ear normal.     Left Ear: Tympanic membrane, ear canal and external ear normal.     Nose: Nose normal.     Mouth/Throat:     Mouth: Mucous membranes are moist.     Pharynx: Oropharynx is clear.  Eyes:     Conjunctiva/sclera: Conjunctivae normal.     Pupils: Pupils are equal, round, and reactive to light.  Neck:     Thyroid: No thyromegaly.  Cardiovascular:     Rate and Rhythm: Normal rate and regular rhythm.     Heart sounds: Normal heart sounds. No murmur heard. Pulmonary:     Effort: Pulmonary effort is normal. No respiratory distress.      Breath sounds: Normal breath sounds.  Abdominal:     General: There is no distension.     Palpations: Abdomen is soft. There is no mass.     Tenderness: There is no abdominal tenderness.  Musculoskeletal:        General: Normal range of motion.     Cervical back: Normal range of motion and neck supple.  Skin:    General: Skin is warm and dry.  Neurological:     General: No focal deficit present.     Mental Status: She  is alert and oriented to person, place, and time.  Psychiatric:        Mood and Affect: Mood normal.        Behavior: Behavior normal.         Assessment & Plan:   Annual physical exam -     CMP14+EGFR  Screening for lipid disorders -     Lipid panel  Screening for deficiency anemia -     CBC with Differential/Platelet  Encounter to establish care     Return in about 1 year (around 09/26/2024) for physical.   Tommie Raymond, MD

## 2023-09-28 ENCOUNTER — Telehealth: Payer: Self-pay | Admitting: Family Medicine

## 2023-09-28 ENCOUNTER — Encounter: Payer: Self-pay | Admitting: Family Medicine

## 2023-09-28 LAB — CMP14+EGFR
ALT: 9 [IU]/L (ref 0–32)
AST: 13 [IU]/L (ref 0–40)
Albumin: 4.4 g/dL (ref 3.9–4.9)
Alkaline Phosphatase: 42 [IU]/L — ABNORMAL LOW (ref 44–121)
BUN/Creatinine Ratio: 17 (ref 9–23)
BUN: 12 mg/dL (ref 6–20)
Bilirubin Total: 0.7 mg/dL (ref 0.0–1.2)
CO2: 18 mmol/L — ABNORMAL LOW (ref 20–29)
Calcium: 9.1 mg/dL (ref 8.7–10.2)
Chloride: 106 mmol/L (ref 96–106)
Creatinine, Ser: 0.72 mg/dL (ref 0.57–1.00)
Globulin, Total: 2.3 g/dL (ref 1.5–4.5)
Glucose: 85 mg/dL (ref 70–99)
Potassium: 4.3 mmol/L (ref 3.5–5.2)
Sodium: 139 mmol/L (ref 134–144)
Total Protein: 6.7 g/dL (ref 6.0–8.5)
eGFR: 112 mL/min/{1.73_m2} (ref >59)

## 2023-09-28 LAB — CBC WITH DIFFERENTIAL/PLATELET
Basophils Absolute: 0 10*3/uL (ref 0.0–0.2)
Basos: 1 %
EOS (ABSOLUTE): 0.1 10*3/uL (ref 0.0–0.4)
Eos: 2 %
Hematocrit: 40.4 % (ref 34.0–46.6)
Hemoglobin: 13.1 g/dL (ref 11.1–15.9)
Immature Grans (Abs): 0 10*3/uL (ref 0.0–0.1)
Immature Granulocytes: 0 %
Lymphocytes Absolute: 2.3 10*3/uL (ref 0.7–3.1)
Lymphs: 43 %
MCH: 28.2 pg (ref 26.6–33.0)
MCHC: 32.4 g/dL (ref 31.5–35.7)
MCV: 87 fL (ref 79–97)
Monocytes Absolute: 0.5 10*3/uL (ref 0.1–0.9)
Monocytes: 10 %
Neutrophils Absolute: 2.4 10*3/uL (ref 1.4–7.0)
Neutrophils: 44 %
Platelets: 259 10*3/uL (ref 150–450)
RBC: 4.65 x10E6/uL (ref 3.77–5.28)
RDW: 13.3 % (ref 11.7–15.4)
WBC: 5.3 10*3/uL (ref 3.4–10.8)

## 2023-09-28 LAB — LIPID PANEL
Chol/HDL Ratio: 2.1 {ratio} (ref 0.0–4.4)
Cholesterol, Total: 157 mg/dL (ref 100–199)
HDL: 75 mg/dL (ref >39)
LDL Chol Calc (NIH): 69 mg/dL (ref 0–99)
Triglycerides: 67 mg/dL (ref 0–149)
VLDL Cholesterol Cal: 13 mg/dL (ref 5–40)

## 2023-09-28 NOTE — Telephone Encounter (Signed)
 Copied from CRM [id number]. Topic: General - Other >> Sep 28, 2023  1:05 PM Yolanda T wrote: Reason for CRM: patient called stated she was returning Anjelica's call. Please f/u with patient

## 2023-09-28 NOTE — Progress Notes (Signed)
I called and spoke with patient made her aware that MD Andrey Campanile stated that her results are normal/clinically unremarkable - no further management indicated at this time

## 2023-09-28 NOTE — Telephone Encounter (Signed)
Copied from CRM [id number]. Topic: General - Call Back - No Documentation >> Sep 28, 2023  8:36 AM Marlow Baars wrote: Reason for CRM: The patient called back in reiterating how she absolutely wants to start the medication her provider talked to her about as well as proceed with the referral to see a counselor. Please assist her as soon as possible as she had a bad episode last night until this morning.

## 2023-10-01 ENCOUNTER — Other Ambulatory Visit: Payer: Self-pay | Admitting: Family Medicine

## 2023-10-01 MED ORDER — SERTRALINE HCL 50 MG PO TABS: 50.0000 mg | ORAL_TABLET | Freq: Every day | ORAL | 1 refills | Status: DC

## 2023-10-01 NOTE — Telephone Encounter (Signed)
Called patient back and told her that I would follow up with Dr Andrey Campanile on Monday. Patient wanted a referral place for Behavioral Health and wanted to start medication that the provider mentioned during her visit last week. I also offered since the provider was out of the office Friday afternoon , the option to be seen at the [facility] if another panic attack happened over the weekend.

## 2023-10-04 ENCOUNTER — Telehealth: Payer: Self-pay | Admitting: Licensed Clinical Social Worker

## 2023-10-04 NOTE — Telephone Encounter (Signed)
 LCSWA called patient today to introduce herself and to assess patients' mental health needs. Patient did answer the phone. LCSWA was able to speak with pt about her depression that she says is due to life stressors.  She just started taking medication and says she feels its already giving her a sense of relief. She does want to talk further about what's going on in her life she feels she can't talk to a lot of people around her cause they are dealing with their own stress.and doesn't want to burden others. She does care for two young kids as well.  Patient was referred by PCP for life stressors and depression.

## 2023-10-22 NOTE — Telephone Encounter (Unsigned)
 Copied from CRM [id number]. Topic: Clinical - Medical Advice >> Oct 22, 2023 10:30 AM Gildardo Pounds wrote: Reason for CRM: Patient tested positive for Covid on Friday, and wants to know how long she needs to quarantine. Callback number is [phone]

## 2023-10-22 NOTE — Telephone Encounter (Signed)
 I called and spoke with patient and made her aware that NP stephens said that she should quarantine for about a week.

## 2023-10-23 ENCOUNTER — Encounter: Payer: Self-pay | Admitting: Licensed Clinical Social Worker

## 2023-10-25 ENCOUNTER — Encounter: Payer: Self-pay | Admitting: Licensed Clinical Social Worker

## 2023-10-26 ENCOUNTER — Encounter: Payer: Self-pay | Admitting: Family Medicine

## 2023-10-26 ENCOUNTER — Ambulatory Visit (INDEPENDENT_AMBULATORY_CARE_PROVIDER_SITE_OTHER): Payer: Managed Care, Other (non HMO) | Admitting: Family Medicine

## 2023-10-26 ENCOUNTER — Ambulatory Visit: Payer: Managed Care, Other (non HMO) | Admitting: Family Medicine

## 2023-10-26 VITALS — BP 98/57 | HR 74 | Temp 98.4°F | Resp 16 | Ht 63.0 in | Wt 140.6 lb

## 2023-10-26 DIAGNOSIS — F32A Depression, unspecified: Secondary | ICD-10-CM

## 2023-10-26 DIAGNOSIS — F419 Anxiety disorder, unspecified: Secondary | ICD-10-CM

## 2023-10-26 MED ORDER — SERTRALINE HCL 100 MG PO TABS: 100.0000 mg | ORAL_TABLET | Freq: Every day | ORAL | 1 refills | Status: DC

## 2023-10-26 NOTE — Progress Notes (Signed)
 Established Patient Office Visit  Subjective    Patient ID: Julia Price, female    DOB: 1990/10/27  Age: 33 y.o. MRN: [id number]  CC:  Chief Complaint  Patient presents with   Follow-up    HPI Julia Price presents for follow up of anxiety/depression. Patient reports that she has continued to have increased social stressors. She reports initial improvement but would like to try a higher dosing.   Outpatient Encounter Medications as of 10/26/2023  Medication Sig   albuterol (VENTOLIN HFA) 108 (90 Base) MCG/ACT inhaler Inhale 1-2 puffs into the lungs every 6 (six) hours as needed for wheezing or shortness of breath.   gabapentin (NEURONTIN) 300 MG capsule Take 1 capsule (300 mg total) by mouth 3 (three) times daily.   ibuprofen (ADVIL) 800 MG tablet Take 1 tablet (800 mg total) by mouth every 8 (eight) hours.   ondansetron (ZOFRAN-ODT) 8 MG disintegrating tablet Take 1 tablet (8 mg total) by mouth every 8 (eight) hours as needed for nausea or vomiting.   sertraline (ZOLOFT) 100 MG tablet Take 1 tablet (100 mg total) by mouth daily.   sertraline (ZOLOFT) 50 MG tablet Take 1 tablet (50 mg total) by mouth daily.   amoxicillin-clavulanate (AUGMENTIN) 875-125 MG tablet Take 1 tablet by mouth every 12 (twelve) hours. (Patient not taking: Reported on 09/27/2023)   fluticasone (FLONASE) 50 MCG/ACT nasal spray Place 2 sprays into both nostrils daily for 7 days.   HYDROcodone-acetaminophen (NORCO/VICODIN) 5-325 MG tablet Take 1 tablet by mouth every 6 (six) hours as needed. (Patient not taking: Reported on 09/27/2023)   methocarbamol (ROBAXIN) 500 MG tablet Take 1 tablet (500 mg total) by mouth 3 (three) times daily as needed for muscle spasms. (Patient not taking: Reported on 09/27/2023)   oxyCODONE-acetaminophen (PERCOCET/ROXICET) 5-325 MG tablet Take 1 tablet by mouth every 6 (six) hours as needed for severe pain (pain score 7-10). (Patient not taking: Reported on 09/27/2023)   No  facility-administered encounter medications on file as of 10/26/2023.    Past Medical History:  Diagnosis Date   Abnormal Pap smear of vagina    Arthritis    Intractable cyclical vomiting     Past Surgical History:  Procedure Laterality Date   NO PAST SURGERIES     VAGINAL HYSTERECTOMY Bilateral 05/06/2019   Procedure: HYSTERECTOMY VAGINAL WITH SALPINGECTOMY;  Surgeon: Allie Bossier, MD;  Location: [facility] OR;  Service: Gynecology;  Laterality: Bilateral;    Family History  Problem Relation Age of Onset   Diabetes Maternal Grandfather    Hypertension Maternal Grandfather     Social History   Socioeconomic History   Marital status: Single    Spouse name: Not on file   Number of children: 1   Years of education: Not on file   Highest education level: Some college, no degree  Occupational History   Not on file  Tobacco Use   Smoking status: Some Days    Current packs/day: 0.00    Types: Cigarettes    Last attempt to quit: 08/28/2014    Years since quitting: 9.1   Smokeless tobacco: Never  Vaping Use   Vaping status: Never Used  Substance and Sexual Activity   Alcohol use: Yes    Comment: occassionally   Drug use: Yes    Types: Marijuana    Comment: very occasional    Sexual activity: Yes    Birth control/protection: None    Comment: with female  Other Topics Concern   Not  on file  Social History Narrative   Not on file   Social Drivers of Health   Financial Resource Strain: Medium Risk (09/27/2023)   Overall Financial Resource Strain (CARDIA)    Difficulty of Paying Living Expenses: Somewhat hard  Food Insecurity: Food Insecurity Present (09/27/2023)   Hunger Vital Sign    Worried About Running Out of Food in the Last Year: Often true    Ran Out of Food in the Last Year: Often true  Transportation Needs: No Transportation Needs (09/27/2023)   PRAPARE - Administrator, Civil Service (Medical): No    Lack of Transportation (Non-Medical): No  Physical  Activity: Sufficiently Active (09/27/2023)   Exercise Vital Sign    Days of Exercise per Week: 5 days    Minutes of Exercise per Session: 30 min  Stress: Stress Concern Present (09/27/2023)   [employer] of Occupational Health - Occupational Stress Questionnaire    Feeling of Stress : Very much  Social Connections: Socially Isolated (09/27/2023)   Social Connection and Isolation Panel [NHANES]    Frequency of Communication with Friends and Family: More than three times a week    Frequency of Social Gatherings with Friends and Family: More than three times a week    Attends Religious Services: Never    Database administrator or Organizations: No    Attends Banker Meetings: Never    Marital Status: Never married  Intimate Partner Violence: Not At Risk (09/27/2023)   Humiliation, Afraid, Rape, and Kick questionnaire    Fear of Current or Ex-Partner: No    Emotionally Abused: No    Physically Abused: No    Sexually Abused: No    Review of Systems  Psychiatric/Behavioral:  Negative for suicidal ideas. The patient is nervous/anxious.   All other systems reviewed and are negative.       Objective    BP (!) 98/57   Pulse 74   Temp 98.4 F (36.9 C) (Oral)   Resp 16   Ht 5\' 3"  (1.6 m)   Wt 140 lb 9.6 oz (63.8 kg)   LMP 04/18/2019   SpO2 98%   BMI 24.91 kg/m   Physical Exam Vitals and nursing note reviewed.  Constitutional:      General: She is not in acute distress. Cardiovascular:     Rate and Rhythm: Normal rate and regular rhythm.  Pulmonary:     Effort: Pulmonary effort is normal.     Breath sounds: Normal breath sounds.  Abdominal:     Palpations: Abdomen is soft.     Tenderness: There is no abdominal tenderness.  Neurological:     General: No focal deficit present.     Mental Status: She is alert and oriented to person, place, and time.  Psychiatric:        Mood and Affect: Mood is depressed. Affect is flat.         Assessment & Plan:    1. Anxiety and depression (Primary) Will increase zoloft to 100 mg daily and monitor. Encouraged counseling. Awaiting SW follow up  Return in about 4 weeks (around 11/23/2023) for follow up.   Tommie Raymond, MD

## 2023-10-30 ENCOUNTER — Ambulatory Visit: Payer: Managed Care, Other (non HMO) | Admitting: Licensed Clinical Social Worker

## 2023-10-30 DIAGNOSIS — F4320 Adjustment disorder, unspecified: Secondary | ICD-10-CM

## 2023-10-31 ENCOUNTER — Ambulatory Visit (INDEPENDENT_AMBULATORY_CARE_PROVIDER_SITE_OTHER): Payer: Managed Care, Other (non HMO) | Admitting: Primary Care

## 2023-10-31 DIAGNOSIS — F4320 Adjustment disorder, unspecified: Secondary | ICD-10-CM | POA: Insufficient documentation

## 2023-10-31 NOTE — BH Specialist Note (Signed)
 Integrated Behavioral Health via Telemedicine Visit  [id number] Julia Price [id number]  Number of Integrated Behavioral Health Clinician visits: 1- Initial Visit  Session Start time: 1405   Session End time: 1445  Total time in minutes: 40   Referring Provider: PCP Patient/Family location: work  [facility] Provider location: office All persons participating in visit: pt and LCSWA Types of Service: Individual psychotherapy  I connected with Anson Crofts via  Telephone or Video Enabled Telemedicine Application  (Video is Caregility application) and verified that I am speaking with the correct person using two identifiers. Discussed confidentiality: Yes   I discussed the limitations of telemedicine and the availability of in person appointments.  Discussed there is a possibility of technology failure and discussed alternative modes of communication if that failure occurs.  I discussed that engaging in this telemedicine visit, they consent to the provision of behavioral healthcare and the services will be billed under their insurance.  Patient and/or legal guardian expressed understanding and consented to Telemedicine visit: Yes   Presenting Concerns: Patient and/or family reports the following symptoms/concerns: anger, mood swings and life stressors  Duration of problem: few months; Severity of problem: mild   Patient Overview: The patient is a woman currently navigating multiple life stressors. She has recently started caring for her stepson full-time, a responsibility she adores, but it has added new challenges to her life. Additionally, she has a 60 year old daughter, with whom she maintains a close relationship. The patient is also experiencing emotional strain following the breakdown of her romantic relationship with her partner. Despite these challenges, she has been actively working on developing healthy relationships and distancing herself from individuals who do  not contribute positively to her life. Her primary current concern is financial stability, as she works through the complexities of her new caregiving role and managing her finances.  Presenting Issues:  Life Stressors: The patient is balancing the demands of full-time caregiving for her stepson, raising her teenage daughter, and navigating a recently failed romantic relationship. These life changes have contributed to emotional distress and an overwhelming sense of responsibility. Relationship Challenges: The end of her romantic relationship has left the patient emotionally vulnerable. She is processing grief while also focusing on personal growth and setting boundaries with others who are not supportive. Financial Stability: The patient is concerned about her financial situation and how to achieve stability, given the increased demands on her time and resources due to caregiving and personal responsibilities.  Interventions: Cognitive Behavioral Therapy (CBT): Work on identifying and reframing negative or overwhelming thoughts related to financial stress and relationship challenges. Help the patient break down large issues into smaller, manageable steps to reduce feelings of helplessness.  Boundary Setting: Focus on strengthening the patient's ability to set and maintain healthy boundaries in relationships, especially with family members and friends who may not be supportive. Role-play scenarios where the patient can practice asserting her needs and limits with others.  Stress Management Techniques: Introduce relaxation techniques such as deep breathing, progressive muscle relaxation, or mindfulness meditation to help the patient manage stress. Encourage her to engage in self-care practices, even if they are brief, to recharge emotionally and mentally.  Financial Planning and Goal Setting: Support the patient in creating a financial plan that includes budgeting, tracking expenses, and identifying  areas for potential savings. Set achievable financial goals to help alleviate some of her concerns and create a sense of control over her financial situation.  Parenting Support: Validate the patient's experience with her stepson and  daughter, acknowledging the challenges and rewards of full-time caregiving. Offer suggestions for self-care and maintaining balance while caring for both children.  Recommendations: Seek Financial Guidance: Recommend that the patient consult with a financial advisor or utilize community resources to address her financial concerns and gain advice on budgeting and long-term planning.  Continue Building Healthy Relationships: Encourage the patient to continue cultivating positive relationships and leaning on supportive friends and family. Suggest she consider joining a support group for single parents or individuals navigating similar relationship changes.  Self-Care and Personal Time: Suggest the patient set aside time for herself, whether it's a hobby, quiet time, or an activity that helps her recharge. Prioritize this time to prevent burnout.   Patient and/or Family's Strengths/Protective Factors: Concrete supports in place (healthy food, safe environments, etc.) and Sense of purpose  Goals Addressed: Patient will:  Reduce symptoms of: stress   Increase knowledge and/or ability of: healthy habits   Demonstrate ability to: Increase healthy adjustment to current life circumstances  Progress towards Goals: Ongoing  Interventions: Interventions utilized:  Mindfulness or Relaxation Training and Supportive Counseling Standardized Assessments completed: Not Needed   Plan: Follow up with behavioral health clinician on : 4 weeks Behavioral recommendation: Revisit the progress on financial planning and assess if the patient feels more in control of her financial situation. Explore the patient's emotional response to her failed relationship and continue to support  her in setting boundaries and healing. Evaluate the effectiveness of stress management techniques and further explore any challenges she's facing with caregiving and parenting. Referral(s): [facility] (In Clinic)  I discussed the assessment and treatment plan with the patient and/or parent/guardian. They were provided an opportunity to ask questions and all were answered. They agreed with the plan and demonstrated an understanding of the instructions.   They were advised to call back or seek an in-person evaluation if the symptoms worsen or if the condition fails to improve as anticipated.  Vassie Loll, LCSWA

## 2023-11-22 ENCOUNTER — Telehealth (INDEPENDENT_AMBULATORY_CARE_PROVIDER_SITE_OTHER): Payer: Managed Care, Other (non HMO) | Admitting: Family Medicine

## 2023-11-22 DIAGNOSIS — F32A Depression, unspecified: Secondary | ICD-10-CM

## 2023-11-22 DIAGNOSIS — F419 Anxiety disorder, unspecified: Secondary | ICD-10-CM | POA: Diagnosis not present

## 2023-11-22 MED ORDER — BUPROPION HCL ER (XL) 150 MG PO TB24: 150.0000 mg | ORAL_TABLET | Freq: Every day | ORAL | 1 refills | Status: DC

## 2023-11-22 NOTE — Progress Notes (Unsigned)
 Virtual Visit via Video Note  I connected with Julia Price on 11/22/23 at  4:00 PM EDT by a video enabled telemedicine application and verified that I am speaking with the correct person using two identifiers.  Location: Patient: [place] - home Provider: [place] - office   I discussed the limitations of evaluation and management by telemedicine and the availability of in person appointments. The patient expressed understanding and agreed to proceed.  History of Present Illness: Patient for follow up of anxiety/depression. She says that she believes she is some better but it has not been as good as she would like.. she reports med compliance.    Observations/Objective:   Assessment and Plan: 1. Anxiety and depression (Primary) Will add wellbutrin to the regimen     Follow Up Instructions:    I discussed the assessment and treatment plan with the patient. The patient was provided an opportunity to ask questions and all were answered. The patient agreed with the plan and demonstrated an understanding of the instructions.   The patient was advised to call back or seek an in-person evaluation if the symptoms worsen or if the condition fails to improve as anticipated.  I provided 5 minutes of non-face-to-face time during this encounter.   Tommie Raymond, MD

## 2023-11-23 ENCOUNTER — Encounter: Payer: Self-pay | Admitting: Family Medicine

## 2023-12-15 ENCOUNTER — Other Ambulatory Visit: Payer: Self-pay | Admitting: Family Medicine

## 2023-12-20 ENCOUNTER — Ambulatory Visit: Admitting: Physician Assistant

## 2023-12-20 ENCOUNTER — Telehealth (INDEPENDENT_AMBULATORY_CARE_PROVIDER_SITE_OTHER): Admitting: Family Medicine

## 2023-12-20 DIAGNOSIS — F32A Depression, unspecified: Secondary | ICD-10-CM

## 2023-12-20 DIAGNOSIS — F419 Anxiety disorder, unspecified: Secondary | ICD-10-CM | POA: Diagnosis not present

## 2023-12-20 MED ORDER — BUPROPION HCL ER (XL) 150 MG PO TB24: 150.0000 mg | ORAL_TABLET | Freq: Every day | ORAL | 1 refills | Status: DC

## 2023-12-20 MED ORDER — SERTRALINE HCL 100 MG PO TABS: 100.0000 mg | ORAL_TABLET | Freq: Every day | ORAL | 1 refills | Status: DC

## 2023-12-20 MED ORDER — ONDANSETRON 8 MG PO TBDP: 8.0000 mg | ORAL_TABLET | Freq: Three times a day (TID) | ORAL | 0 refills | Status: DC | PRN

## 2023-12-20 NOTE — Progress Notes (Unsigned)
 Virtual Visit via Video Note  I connected with Otto Bloodgood on 12/20/23 at 10:00 AM EDT by a video enabled telemedicine application and verified that I am speaking with the correct person using two identifiers.  Location: Patient: Julia Price Provider: [place]   I discussed the limitations of evaluation and management by telemedicine and the availability of in person appointments. The patient expressed understanding and agreed to proceed.  History of Present Illness: Patient is for follow up of anxiety/depression. She reports doing very well with present management.    Observations/Objective:   Assessment and Plan:  1. Anxiety and depression (Primary) Doing well. Meds refilled. Continue   Follow Up Instructions: 6 months   I discussed the assessment and treatment plan with the patient. The patient was provided an opportunity to ask questions and all were answered. The patient agreed with the plan and demonstrated an understanding of the instructions.   The patient was advised to call back or seek an in-person evaluation if the symptoms worsen or if the condition fails to improve as anticipated.  I provided 8 minutes of non-face-to-face time during this encounter.   Arlo Lama, MD

## 2023-12-21 ENCOUNTER — Encounter: Payer: Self-pay | Admitting: Family Medicine

## 2024-03-24 ENCOUNTER — Other Ambulatory Visit: Payer: Self-pay | Admitting: Family Medicine

## 2024-04-19 ENCOUNTER — Other Ambulatory Visit: Payer: Self-pay | Admitting: Family Medicine

## 2024-05-29 ENCOUNTER — Ambulatory Visit: Admitting: Family Medicine

## 2024-05-29 VITALS — BP 99/62 | HR 74 | Ht 63.0 in | Wt 153.8 lb

## 2024-05-29 DIAGNOSIS — F419 Anxiety disorder, unspecified: Secondary | ICD-10-CM

## 2024-05-29 DIAGNOSIS — F32A Depression, unspecified: Secondary | ICD-10-CM

## 2024-05-29 MED ORDER — ONDANSETRON 8 MG PO TBDP: 8.0000 mg | ORAL_TABLET | Freq: Three times a day (TID) | ORAL | 1 refills | Status: AC | PRN

## 2024-05-29 MED ORDER — BUPROPION HCL ER (XL) 300 MG PO TB24: 300.0000 mg | ORAL_TABLET | Freq: Every day | ORAL | 1 refills | Status: AC

## 2024-05-30 ENCOUNTER — Encounter: Payer: Self-pay | Admitting: Family Medicine

## 2024-05-30 NOTE — Progress Notes (Signed)
 Established Patient Office Visit  Subjective    Patient ID: Julia Price, female    DOB: [birth year]  Age: 33 y.o. MRN: [id number]  CC:  Chief Complaint  Patient presents with   Medical Management of Chronic Issues    HPI Julia Price presents for follow up for anxiety/depression. Patient reports that she is considering increasing her meds.   Outpatient Encounter Medications as of 05/29/2024  Medication Sig   albuterol  (VENTOLIN  HFA) 108 (90 Base) MCG/ACT inhaler Inhale 1-2 puffs into the lungs every 6 (six) hours as needed for wheezing or shortness of breath.   buPROPion  (WELLBUTRIN  XL) 150 MG 24 hr tablet TAKE 1 TABLET BY MOUTH EVERY DAY   buPROPion  (WELLBUTRIN  XL) 300 MG 24 hr tablet Take 1 tablet (300 mg total) by mouth daily.   fluticasone  (FLONASE ) 50 MCG/ACT nasal spray Place 2 sprays into both nostrils daily for 7 days.   ibuprofen  (ADVIL ) 800 MG tablet Take 1 tablet (800 mg total) by mouth every 8 (eight) hours.   sertraline  (ZOLOFT ) 100 MG tablet TAKE 1 TABLET BY MOUTH EVERY DAY   [DISCONTINUED] ondansetron  (ZOFRAN -ODT) 8 MG disintegrating tablet Take 1 tablet (8 mg total) by mouth every 8 (eight) hours as needed for nausea or vomiting.   amoxicillin -clavulanate (AUGMENTIN ) 875-125 MG tablet Take 1 tablet by mouth every 12 (twelve) hours. (Patient not taking: Reported on 09/27/2023)   gabapentin  (NEURONTIN ) 300 MG capsule Take 1 capsule (300 mg total) by mouth 3 (three) times daily. (Patient not taking: Reported on 05/29/2024)   HYDROcodone -acetaminophen  (NORCO/VICODIN) 5-325 MG tablet Take 1 tablet by mouth every 6 (six) hours as needed. (Patient not taking: Reported on 09/27/2023)   methocarbamol  (ROBAXIN ) 500 MG tablet Take 1 tablet (500 mg total) by mouth 3 (three) times daily as needed for muscle spasms. (Patient not taking: Reported on 09/27/2023)   ondansetron  (ZOFRAN -ODT) 8 MG disintegrating tablet Take 1 tablet (8 mg total) by mouth every 8 (eight) hours as  needed for nausea or vomiting.   oxyCODONE -acetaminophen  (PERCOCET/ROXICET) 5-325 MG tablet Take 1 tablet by mouth every 6 (six) hours as needed for severe pain (pain score 7-10). (Patient not taking: Reported on 09/27/2023)   No facility-administered encounter medications on file as of 05/29/2024.    Past Medical History:  Diagnosis Date   Abnormal Pap smear of vagina    Arthritis    Intractable cyclical vomiting     Past Surgical History:  Procedure Laterality Date   NO PAST SURGERIES     VAGINAL HYSTERECTOMY Bilateral 05/06/2019   Procedure: HYSTERECTOMY VAGINAL WITH SALPINGECTOMY;  Surgeon: Starla Harland BROCKS, MD;  Location: [facility] OR;  Service: Gynecology;  Laterality: Bilateral;    Family History  Problem Relation Age of Onset   Diabetes Maternal Grandfather    Hypertension Maternal Grandfather     Social History   Socioeconomic History   Marital status: Single    Spouse name: Not on file   Number of children: 1   Years of education: Not on file   Highest education level: Some college, no degree  Occupational History   Not on file  Tobacco Use   Smoking status: Some Days    Current packs/day: 0.00    Types: Cigarettes    Last attempt to quit: 08/28/2014    Years since quitting: 9.7   Smokeless tobacco: Never  Vaping Use   Vaping status: Never Used  Substance and Sexual Activity   Alcohol use: Yes    Comment: occassionally  Drug use: Yes    Types: Marijuana    Comment: very occasional    Sexual activity: Yes    Birth control/protection: None    Comment: with female  Other Topics Concern   Not on file  Social History Narrative   Not on file   Social Drivers of Health   Financial Resource Strain: Medium Risk (05/28/2024)   Overall Financial Resource Strain (CARDIA)    Difficulty of Paying Living Expenses: Somewhat hard  Food Insecurity: Food Insecurity Present (05/28/2024)   Hunger Vital Sign    Worried About Running Out of Food in the Last Year: Sometimes true     Ran Out of Food in the Last Year: Sometimes true  Transportation Needs: No Transportation Needs (05/28/2024)   PRAPARE - Administrator, Civil Service (Medical): No    Lack of Transportation (Non-Medical): No  Physical Activity: Sufficiently Active (05/28/2024)   Exercise Vital Sign    Days of Exercise per Week: 5 days    Minutes of Exercise per Session: 30 min  Stress: No Stress Concern Present (05/28/2024)   [employer] of Occupational Health - Occupational Stress Questionnaire    Feeling of Stress: Only a little  Social Connections: Socially Isolated (05/28/2024)   Social Connection and Isolation Panel    Frequency of Communication with Friends and Family: More than three times a week    Frequency of Social Gatherings with Friends and Family: Twice a week    Attends Religious Services: Never    Database administrator or Organizations: No    Attends Engineer, structural: Not on file    Marital Status: Never married  Intimate Partner Violence: Not At Risk (09/27/2023)   Humiliation, Afraid, Rape, and Kick questionnaire    Fear of Current or Ex-Partner: No    Emotionally Abused: No    Physically Abused: No    Sexually Abused: No    Review of Systems  Psychiatric/Behavioral:  Positive for depression. The patient is nervous/anxious.   All other systems reviewed and are negative.       Objective    BP 99/62   Pulse 74   Ht 5' 3 (1.6 m)   Wt 153 lb 12.8 oz (69.8 kg)   LMP 04/18/2019   SpO2 97%   BMI 27.24 kg/m   Physical Exam Vitals and nursing note reviewed.  Constitutional:      General: She is not in acute distress. Cardiovascular:     Rate and Rhythm: Normal rate and regular rhythm.  Pulmonary:     Effort: Pulmonary effort is normal.     Breath sounds: Normal breath sounds.  Abdominal:     Palpations: Abdomen is soft.     Tenderness: There is no abdominal tenderness.  Neurological:     General: No focal deficit present.     Mental  Status: She is alert and oriented to person, place, and time.  Psychiatric:        Mood and Affect: Mood is depressed. Affect is flat.         Assessment & Plan:   Anxiety and depression  Other orders -     buPROPion  HCl ER (XL); Take 1 tablet (300 mg total) by mouth daily.  Dispense: 90 tablet; Refill: 1 -     Ondansetron ; Take 1 tablet (8 mg total) by mouth every 8 (eight) hours as needed for nausea or vomiting.  Dispense: 20 tablet; Refill: 1  Will increase wellbutrin  from 150  to 300 mg daily  Return in about 6 months (around 11/27/2024) for follow up, chronic med issues.   Tanda Raguel SQUIBB, MD

## 2024-06-02 ENCOUNTER — Other Ambulatory Visit: Payer: Self-pay | Admitting: Family Medicine

## 2024-06-02 MED ORDER — SERTRALINE HCL 100 MG PO TABS: 100.0000 mg | ORAL_TABLET | Freq: Every day | ORAL | 0 refills | Status: DC

## 2024-06-17 ENCOUNTER — Ambulatory Visit (INDEPENDENT_AMBULATORY_CARE_PROVIDER_SITE_OTHER): Admitting: Family

## 2024-06-17 ENCOUNTER — Encounter: Payer: Self-pay | Admitting: Family

## 2024-06-17 ENCOUNTER — Other Ambulatory Visit (HOSPITAL_COMMUNITY)
Admission: RE | Admit: 2024-06-17 | Discharge: 2024-06-17 | Disposition: A | Discharge status: Home / Self Care | Source: Ambulatory Visit | Attending: Family | Admitting: Family

## 2024-06-17 ENCOUNTER — Encounter: Payer: Self-pay | Admitting: Family Medicine

## 2024-06-17 ENCOUNTER — Ambulatory Visit (INDEPENDENT_AMBULATORY_CARE_PROVIDER_SITE_OTHER)

## 2024-06-17 VITALS — BP 109/70 | HR 89 | Temp 98.8°F | Resp 16 | Ht 63.0 in | Wt 152.6 lb

## 2024-06-17 DIAGNOSIS — Z113 Encounter for screening for infections with a predominantly sexual mode of transmission: Secondary | ICD-10-CM | POA: Diagnosis present

## 2024-06-17 DIAGNOSIS — M5431 Sciatica, right side: Secondary | ICD-10-CM | POA: Diagnosis not present

## 2024-06-17 DIAGNOSIS — Z114 Encounter for screening for human immunodeficiency virus [HIV]: Secondary | ICD-10-CM

## 2024-06-17 DIAGNOSIS — F419 Anxiety disorder, unspecified: Secondary | ICD-10-CM

## 2024-06-17 DIAGNOSIS — M549 Dorsalgia, unspecified: Secondary | ICD-10-CM

## 2024-06-17 DIAGNOSIS — F32A Depression, unspecified: Secondary | ICD-10-CM

## 2024-06-17 MED ORDER — MELOXICAM 7.5 MG PO TABS: 7.5000 mg | ORAL_TABLET | Freq: Every day | ORAL | 0 refills | Status: AC

## 2024-06-17 MED ORDER — DULOXETINE HCL 30 MG PO CPEP: 30.0000 mg | ORAL_CAPSULE | Freq: Every day | ORAL | 0 refills | Status: DC

## 2024-06-17 NOTE — Progress Notes (Signed)
 Back pain and STI check, patient scored 12 on the PHQ-9, patient scored a 10 on the GAD-7

## 2024-06-17 NOTE — Progress Notes (Signed)
 Patient ID: Julia Price, female    DOB: Apr 24, 1991  MRN: [id number]  CC: Back Pain  Subjective: Julia Price is a 33 y.o. female who presents for back pain.   Her concerns today include:  - States back pain began last week after falling on her mid-lower back. Denies red flag symptoms. States she did not hit her head and did not lose consciousness. Taking over-the-counter medication with minimal relief. - States history of right-sided sciatica. Denies red flag symptoms. States in the past taking Gabapentin  with minimal relief.  - Routine STD screening. Denies symptoms/recent exposure. - Anxiety depression. Doing well on Sertraline  and Bupropion , no issues/concerns. States she is no longer established with her therapist per her preference. She does not complain of red flag symptoms such as but not limited to chest pain, shortness of breath, worst headache of life, nausea/vomiting.   Patient Active Problem List   Diagnosis Date Noted   Adjustment disorder 10/31/2023   Brachial plexopathy 08/02/2022   DUB (dysfunctional uterine bleeding) 05/06/2019   Post-operative state 05/06/2019   Low grade squamous intraepith lesion on cytologic smear cervix (lgsil) 10/02/2018   Abnormal uterine bleeding (AUB) 04/28/2018   Acute bronchitis 07/27/2014   Vaginal pain 09/08/2013   Eczema 09/08/2013   Hematemesis 12/06/2011   Nipple problem 11/30/2011   Strain of lumbar region 06/19/2011   Sickle-cell trait 01/29/2008   TOBACCO ABUSE, HX OF 01/29/2008     Current Outpatient Medications on File Prior to Visit  Medication Sig Dispense Refill   albuterol  (VENTOLIN  HFA) 108 (90 Base) MCG/ACT inhaler Inhale 1-2 puffs into the lungs every 6 (six) hours as needed for wheezing or shortness of breath. 18 g 0   buPROPion  (WELLBUTRIN  XL) 300 MG 24 hr tablet Take 1 tablet (300 mg total) by mouth daily. 90 tablet 1   fluticasone  (FLONASE ) 50 MCG/ACT nasal spray Place 2 sprays into both nostrils daily  for 7 days. 1 g 0   ibuprofen  (ADVIL ) 800 MG tablet Take 1 tablet (800 mg total) by mouth every 8 (eight) hours. 30 tablet 0   ondansetron  (ZOFRAN -ODT) 8 MG disintegrating tablet Take 1 tablet (8 mg total) by mouth every 8 (eight) hours as needed for nausea or vomiting. 20 tablet 1   sertraline  (ZOLOFT ) 100 MG tablet Take 1 tablet (100 mg total) by mouth daily. 90 tablet 0   amoxicillin -clavulanate (AUGMENTIN ) 875-125 MG tablet Take 1 tablet by mouth every 12 (twelve) hours. (Patient not taking: Reported on 09/27/2023) 14 tablet 0   buPROPion  (WELLBUTRIN  XL) 150 MG 24 hr tablet TAKE 1 TABLET BY MOUTH EVERY DAY (Patient not taking: Reported on 06/17/2024) 90 tablet 0   gabapentin  (NEURONTIN ) 300 MG capsule Take 1 capsule (300 mg total) by mouth 3 (three) times daily. (Patient not taking: Reported on 05/29/2024) 60 capsule 1   HYDROcodone -acetaminophen  (NORCO/VICODIN) 5-325 MG tablet Take 1 tablet by mouth every 6 (six) hours as needed. (Patient not taking: Reported on 09/27/2023) 8 tablet 0   methocarbamol  (ROBAXIN ) 500 MG tablet Take 1 tablet (500 mg total) by mouth 3 (three) times daily as needed for muscle spasms. (Patient not taking: Reported on 09/27/2023) 90 tablet 1   oxyCODONE -acetaminophen  (PERCOCET/ROXICET) 5-325 MG tablet Take 1 tablet by mouth every 6 (six) hours as needed for severe pain (pain score 7-10). (Patient not taking: Reported on 09/27/2023) 6 tablet 0   No current facility-administered medications on file prior to visit.    Allergies  Allergen Reactions   Latex  Hives and Rash    Social History   Socioeconomic History   Marital status: Single    Spouse name: Not on file   Number of children: 1   Years of education: Not on file   Highest education level: Some college, no degree  Occupational History   Not on file  Tobacco Use   Smoking status: Some Days    Current packs/day: 0.00    Types: Cigarettes    Last attempt to quit: 08/28/2014    Years since quitting: 9.8    Smokeless tobacco: Never  Vaping Use   Vaping status: Never Used  Substance and Sexual Activity   Alcohol use: Yes    Comment: occassionally   Drug use: Yes    Types: Marijuana    Comment: very occasional    Sexual activity: Yes    Birth control/protection: None    Comment: with female  Other Topics Concern   Not on file  Social History Narrative   Not on file   Social Drivers of Health   Financial Resource Strain: Medium Risk (06/16/2024)   Overall Financial Resource Strain (CARDIA)    Difficulty of Paying Living Expenses: Somewhat hard  Food Insecurity: Food Insecurity Present (06/16/2024)   Hunger Vital Sign    Worried About Running Out of Food in the Last Year: Sometimes true    Ran Out of Food in the Last Year: Sometimes true  Transportation Needs: No Transportation Needs (06/16/2024)   PRAPARE - Administrator, Civil Service (Medical): No    Lack of Transportation (Non-Medical): No  Physical Activity: Sufficiently Active (06/16/2024)   Exercise Vital Sign    Days of Exercise per Week: 5 days    Minutes of Exercise per Session: 150+ min  Stress: No Stress Concern Present (06/16/2024)   [employer] of Occupational Health - Occupational Stress Questionnaire    Feeling of Stress: Only a little  Social Connections: Socially Isolated (06/16/2024)   Social Connection and Isolation Panel    Frequency of Communication with Friends and Family: More than three times a week    Frequency of Social Gatherings with Friends and Family: More than three times a week    Attends Religious Services: Never    Database administrator or Organizations: No    Attends Engineer, structural: Not on file    Marital Status: Never married  Intimate Partner Violence: Not At Risk (09/27/2023)   Humiliation, Afraid, Rape, and Kick questionnaire    Fear of Current or Ex-Partner: No    Emotionally Abused: No    Physically Abused: No    Sexually Abused: No    Family  History  Problem Relation Age of Onset   Diabetes Maternal Grandfather    Hypertension Maternal Grandfather     Past Surgical History:  Procedure Laterality Date   NO PAST SURGERIES     VAGINAL HYSTERECTOMY Bilateral 05/06/2019   Procedure: HYSTERECTOMY VAGINAL WITH SALPINGECTOMY;  Surgeon: Starla Harland BROCKS, MD;  Location: [facility] OR;  Service: Gynecology;  Laterality: Bilateral;    ROS: Review of Systems Negative except as stated above  PHYSICAL EXAM: BP 109/70   Pulse 89   Temp 98.8 F (37.1 C) (Oral)   Resp 16   Ht 5' 3 (1.6 m)   Wt 152 lb 9.6 oz (69.2 kg)   LMP 04/18/2019   SpO2 97%   BMI 27.03 kg/m   Physical Exam HENT:     Head: Normocephalic and atraumatic.  Nose: Nose normal.     Mouth/Throat:     Mouth: Mucous membranes are moist.     Pharynx: Oropharynx is clear.  Eyes:     Extraocular Movements: Extraocular movements intact.     Conjunctiva/sclera: Conjunctivae normal.     Pupils: Pupils are equal, round, and reactive to light.  Cardiovascular:     Rate and Rhythm: Normal rate and regular rhythm.     Pulses: Normal pulses.     Heart sounds: Normal heart sounds.  Pulmonary:     Effort: Pulmonary effort is normal.     Breath sounds: Normal breath sounds.  Musculoskeletal:        General: Normal range of motion.     Cervical back: Normal, normal range of motion and neck supple.     Thoracic back: Normal.     Lumbar back: Normal.     Right hip: Normal.     Left hip: Normal.     Right upper leg: Normal.     Left upper leg: Normal.     Right knee: Normal.     Left knee: Normal.     Right lower leg: Normal.     Left lower leg: Normal.     Right ankle: Normal.     Left ankle: Normal.     Right foot: Normal.     Left foot: Normal.  Neurological:     General: No focal deficit present.     Mental Status: She is alert and oriented to person, place, and time.  Psychiatric:        Mood and Affect: Mood normal.        Behavior: Behavior normal.     ASSESSMENT AND PLAN: 1. Back pain, unspecified back location, unspecified back pain laterality, unspecified chronicity (Primary) - Meloxicam and Duloxetine as prescribed. Counseled on medication adherence/adverse effects.  - DG Thoracic Spine W/Swimmers and DG Lumbar Spine for evaluation.  - Patient declined referral to Orthopedics.  - Follow-up with primary provider as scheduled. - meloxicam (MOBIC) 7.5 MG tablet; Take 1 tablet (7.5 mg total) by mouth daily.  Dispense: 90 tablet; Refill: 0 - DULoxetine (CYMBALTA) 30 MG capsule; Take 1 capsule (30 mg total) by mouth daily.  Dispense: 90 capsule; Refill: 0 - DG Thoracic Spine W/Swimmers; Future - DG Lumbar Spine Complete; Future  2. Sciatica of right side - Duloxetine as prescribed. Counseled on medication adherence/adverse effects.  - Patient declined referral to Neurology.  - Follow-up with primary provider as scheduled.  - DULoxetine (CYMBALTA) 30 MG capsule; Take 1 capsule (30 mg total) by mouth daily.  Dispense: 90 capsule; Refill: 0  3. Routine screening for STI (sexually transmitted infection) - Routine screening.  - Cervicovaginal ancillary only  4. Encounter for screening for HIV - Routine screening.  - HIV antibody (with reflex)  5. Anxiety and depression - Patient denies thoughts of self-harm, suicidal ideations, homicidal ideations. - Continue Sertraline  and Bupropion  as prescribed. Counseled on medication adherence/adverse effects. No refills needed as of present.  - Follow-up with primary provider as scheduled.   Patient was given the opportunity to ask questions.  Patient verbalized understanding of the plan and was able to repeat key elements of the plan. Patient was given clear instructions to go to Emergency Department or return to medical center if symptoms don't improve, worsen, or new problems develop.The patient verbalized understanding.   Orders Placed This Encounter  Procedures   DG Thoracic Spine  W/Swimmers   DG Lumbar Spine Complete  HIV antibody (with reflex)     Requested Prescriptions   Signed Prescriptions Disp Refills   meloxicam (MOBIC) 7.5 MG tablet 90 tablet 0    Sig: Take 1 tablet (7.5 mg total) by mouth daily.   DULoxetine (CYMBALTA) 30 MG capsule 90 capsule 0    Sig: Take 1 capsule (30 mg total) by mouth daily.    Return for Follow-Up or next available with Raguel Blush, MD.  Greig JINNY Drones, NP

## 2024-06-18 ENCOUNTER — Ambulatory Visit: Payer: Self-pay | Admitting: Family

## 2024-06-18 ENCOUNTER — Ambulatory Visit: Payer: Self-pay

## 2024-06-18 DIAGNOSIS — B3731 Acute candidiasis of vulva and vagina: Secondary | ICD-10-CM

## 2024-06-18 LAB — CERVICOVAGINAL ANCILLARY ONLY
Bacterial Vaginitis (gardnerella): NEGATIVE
Candida Glabrata: NEGATIVE
Candida Vaginitis: POSITIVE — AB
Chlamydia: NEGATIVE
Comment: NEGATIVE
Comment: NEGATIVE
Comment: NEGATIVE
Comment: NEGATIVE
Comment: NEGATIVE
Comment: NORMAL
Neisseria Gonorrhea: NEGATIVE
Trichomonas: NEGATIVE

## 2024-06-18 LAB — HIV ANTIBODY (ROUTINE TESTING W REFLEX): HIV Screen 4th Generation wRfx: NONREACTIVE

## 2024-06-18 MED ORDER — FLUCONAZOLE 150 MG PO TABS: 150.0000 mg | ORAL_TABLET | ORAL | 0 refills | Status: AC

## 2024-06-18 NOTE — Telephone Encounter (Signed)
 FYI Only or Action Required?: Action required by provider: clinical question for provider.  Patient was last seen in primary care on 06/17/2024 by Lorren Greig PARAS, NP.  Called Nurse Triage reporting Vaginitis.  Symptoms began a week ago.  Symptoms are: completely resolved.  Triage Disposition: Home Care  Patient/caregiver understands and will follow disposition?: Yes     Copied from CRM [id number]. Topic: Clinical - Red Word Triage >> Jun 18, 2024  3:21 PM Tiffini S wrote: [facility] that prompted transfer to Nurse Triage: Patient have been contacted about her lab results- she has a yeast infection- states that Monistat makes the symptoms worse- asking for antibiotic      Reason for Disposition  Mild vaginal itching  Answer Assessment - Initial Assessment Questions Patient denies any symptoms but saw her lab result came back positive for candida vaginitis and wanted to know if she should be treated. She states she is also concerned because she is sexually active and didn't want to spread it to her partner. Patient has had a bad reaction to Monistat in the past. Please advise.       1. SYMPTOM: What's the main symptom you're concerned about? (e.g., pain, itching, dryness)     Some itching  2. LOCATION: Where is the itching located? (e.g., inside/outside, left/right)     Outside 3. ONSET: When did the itching start?     1 week ago 4. PAIN: Is there any pain? If Yes, ask: How bad is it? (Scale: 1-10; mild, moderate, severe)     No 5. ITCHING: Is there any itching? If Yes, ask: How bad is it? (Scale: 1-10; mild, moderate, severe)     No itching now 6. CAUSE: What do you think is causing the discharge? Have you had the same problem before? What happened then?     Lab test came back  7. OTHER SYMPTOMS: Do you have any other symptoms? (e.g., fever, itching, vaginal bleeding, pain with urination, injury to genital area, vaginal foreign body)      None  Protocols used: Vaginal Symptoms-A-AH

## 2024-07-07 ENCOUNTER — Ambulatory Visit (INDEPENDENT_AMBULATORY_CARE_PROVIDER_SITE_OTHER)

## 2024-07-07 VITALS — BP 100/65 | HR 93 | Temp 98.7°F | Resp 16 | Wt 156.8 lb

## 2024-07-07 DIAGNOSIS — J029 Acute pharyngitis, unspecified: Secondary | ICD-10-CM

## 2024-07-07 DIAGNOSIS — R52 Pain, unspecified: Secondary | ICD-10-CM

## 2024-07-07 LAB — POCT RAPID STREP A (OFFICE): Rapid Strep A Screen: NEGATIVE

## 2024-07-07 LAB — POC SOFIA 2 FLU + SARS ANTIGEN FIA
Influenza A, POC: NEGATIVE
Influenza B, POC: NEGATIVE
SARS Coronavirus 2 Ag: NEGATIVE

## 2024-07-07 NOTE — Progress Notes (Signed)
     Patient ID: Julia Price, female    DOB: 02-11-1991  MRN: [id number]  CC: Medical Management of Chronic Issues (Patient c/o body ache, headache, sore throat x 3 days)   Subjective: Julia Price is a 33 y.o. female who presents to clinic with chief complaint of 3-day history of body ache, sore throat, headache.  Patient reports show her temperature was 99.6 on Friday, she has not checked that since.  Is currently taking DayQuil and NyQuil for symptom relief and feels the symptoms are overall improving.  Denies cough, runny nose, or sputum.  Denies recent illness, diarrhea or sick contact.    Allergies  Allergen Reactions   Latex Hives and Rash    ROS: Review of Systems Negative except as stated above  PHYSICAL EXAM: BP 100/65   Pulse 93   Temp 98.7 F (37.1 C) (Oral)   Resp 16   Wt 156 lb 12.8 oz (71.1 kg)   LMP 04/18/2019   SpO2 94%   BMI 27.78 kg/m   Physical Exam  General: well-appearing, no acute distress Skin: no jaundice, rashes, or lesions Oropharynx: Erythema of the posterior oropharynx, no exudates Cardiovascular: regular heart rate and rhythm, normal S1/S2, no murmurs, gallops, or rubs, peripheral pulses 2+ bilaterally Chest: no skeletal deformity, lungs clear to auscultation bilaterally, equal breath sounds bilaterally Abdomen: soft, non-distended, non-tender to palpation, no hepatomegaly, no splenomegaly, normoactive bowel sounds Musculoskeletal: normal gait Extremities: no peripheral edema  ASSESSMENT AND PLAN:  1. Sore throat (Primary) - Symptoms more likely attributed to viral upper respiratory infection.  - Recommended continued symptom relief with over-the-counter medications - Recommended well hydration and replenishment of fluids - POCT rapid strep A Negative - POC SOFIA 2 FLU + SARS ANTIGEN FIA Negative     Patient was given the opportunity to ask questions.  Patient verbalized understanding of the plan and was able to repeat key  elements of the plan.  Patient provided with clear return instructions symptoms do not improve.   Orders Placed This Encounter  Procedures   POC SOFIA 2 FLU + SARS ANTIGEN FIA   POCT rapid strep A      No follow-ups on file.  Sula Leavy Rode, PA-C

## 2024-08-06 ENCOUNTER — Ambulatory Visit: Admitting: Family Medicine

## 2024-09-08 ENCOUNTER — Other Ambulatory Visit: Payer: Self-pay | Admitting: Family

## 2024-09-08 DIAGNOSIS — M5431 Sciatica, right side: Secondary | ICD-10-CM

## 2024-09-08 DIAGNOSIS — M549 Dorsalgia, unspecified: Secondary | ICD-10-CM

## 2024-09-08 NOTE — Telephone Encounter (Signed)
 Last refill 06/17/24. Please advise

## 2024-09-08 NOTE — Telephone Encounter (Signed)
 Please send Duloxetine  refill request to patient's primary provider Raguel Blush, MD to see if appropriate for refill.

## 2024-09-08 NOTE — Telephone Encounter (Signed)
 Please advise

## 2024-09-19 ENCOUNTER — Other Ambulatory Visit: Payer: Self-pay | Admitting: Family Medicine

## 2024-09-25 ENCOUNTER — Encounter: Payer: Self-pay | Admitting: Family Medicine

## 2024-11-27 ENCOUNTER — Ambulatory Visit: Admitting: Family Medicine
